# Patient Record
Sex: Female | Born: 1935 | Hispanic: No | Marital: Married | Smoking: Never smoker
Health system: Southern US, Community
[De-identification: ages and names within clinical notes are randomized; demographics above are authoritative.]

## PROBLEM LIST (undated history)

## (undated) DIAGNOSIS — M1990 Unspecified osteoarthritis, unspecified site: Secondary | ICD-10-CM

## (undated) DIAGNOSIS — Z9889 Other specified postprocedural states: Secondary | ICD-10-CM

## (undated) DIAGNOSIS — T4145XA Adverse effect of unspecified anesthetic, initial encounter: Secondary | ICD-10-CM

## (undated) DIAGNOSIS — T8859XA Other complications of anesthesia, initial encounter: Secondary | ICD-10-CM

## (undated) DIAGNOSIS — G919 Hydrocephalus, unspecified: Secondary | ICD-10-CM

## (undated) DIAGNOSIS — R112 Nausea with vomiting, unspecified: Secondary | ICD-10-CM

## (undated) DIAGNOSIS — I10 Essential (primary) hypertension: Secondary | ICD-10-CM

## (undated) DIAGNOSIS — E785 Hyperlipidemia, unspecified: Secondary | ICD-10-CM

## (undated) HISTORY — PX: FRACTURE SURGERY: SHX138

## (undated) HISTORY — PX: CSF SHUNT: SHX92

---

## 1988-10-04 DIAGNOSIS — G919 Hydrocephalus, unspecified: Secondary | ICD-10-CM

## 1988-10-04 HISTORY — DX: Hydrocephalus, unspecified: G91.9

## 2001-03-08 ENCOUNTER — Encounter: Payer: Self-pay | Admitting: Neurosurgery

## 2001-03-08 ENCOUNTER — Encounter: Admission: RE | Admit: 2001-03-08 | Discharge: 2001-03-08 | Payer: Self-pay | Admitting: Neurosurgery

## 2007-03-29 ENCOUNTER — Ambulatory Visit: Payer: Self-pay | Admitting: Vascular Surgery

## 2010-06-18 NOTE — Procedures (Signed)
CAROTID DUPLEX EXAM   INDICATION:  Abdominal __________ .   HISTORY:  Diabetes:  No.  Cardiac:  No.  Hypertension:  Yes.  Smoking:  No.  Previous Surgery:  CV History:  Amaurosis Fugax No, Paresthesias No, Hemiparesis No                                       RIGHT             LEFT  Brachial systolic pressure:         140               140  Brachial Doppler waveforms:         Biphasic          Biphasic  Vertebral direction of flow:        Antegrade         Not well  visualized  DUPLEX VELOCITIES (cm/sec)  CCA peak systolic                   46                67  ECA peak systolic                   86                114  ICA peak systolic                   35                38  ICA end diastolic                   8                 8  PLAQUE MORPHOLOGY:                  Heterogeneous     Heterogeneous  PLAQUE AMOUNT:                      Mild              Mild  PLAQUE LOCATION:                    ICA               ICA   IMPRESSION:  1. 1 to 39% stenosis noted in bilateral ICAs.  2. Antegrade right vertebral arteries.  3. Unable to visualize left vertebral artery.   ___________________________________________  Janetta Hora Fields, MD   MG/MEDQ  D:  03/29/2007  T:  03/30/2007  Job:  [id number]

## 2013-04-14 ENCOUNTER — Emergency Department (HOSPITAL_COMMUNITY)
Admission: EM | Admit: 2013-04-14 | Discharge: 2013-04-14 | Disposition: A | Discharge status: Home / Self Care | Payer: Medicare Other | Attending: Emergency Medicine | Admitting: Emergency Medicine

## 2013-04-14 ENCOUNTER — Encounter (HOSPITAL_COMMUNITY): Payer: Self-pay | Admitting: Emergency Medicine

## 2013-04-14 ENCOUNTER — Emergency Department (HOSPITAL_COMMUNITY): Payer: Medicare Other

## 2013-04-14 DIAGNOSIS — I10 Essential (primary) hypertension: Secondary | ICD-10-CM | POA: Diagnosis not present

## 2013-04-14 DIAGNOSIS — E785 Hyperlipidemia, unspecified: Secondary | ICD-10-CM | POA: Diagnosis not present

## 2013-04-14 DIAGNOSIS — S0990XA Unspecified injury of head, initial encounter: Secondary | ICD-10-CM | POA: Diagnosis present

## 2013-04-14 DIAGNOSIS — S5000XA Contusion of unspecified elbow, initial encounter: Secondary | ICD-10-CM | POA: Insufficient documentation

## 2013-04-14 DIAGNOSIS — Z23 Encounter for immunization: Secondary | ICD-10-CM | POA: Insufficient documentation

## 2013-04-14 DIAGNOSIS — Y9301 Activity, walking, marching and hiking: Secondary | ICD-10-CM | POA: Insufficient documentation

## 2013-04-14 DIAGNOSIS — Y9210 Unspecified residential institution as the place of occurrence of the external cause: Secondary | ICD-10-CM | POA: Diagnosis not present

## 2013-04-14 DIAGNOSIS — W19XXXA Unspecified fall, initial encounter: Secondary | ICD-10-CM | POA: Diagnosis present

## 2013-04-14 DIAGNOSIS — Z79899 Other long term (current) drug therapy: Secondary | ICD-10-CM | POA: Insufficient documentation

## 2013-04-14 DIAGNOSIS — S0083XA Contusion of other part of head, initial encounter: Principal | ICD-10-CM

## 2013-04-14 DIAGNOSIS — T148XXA Other injury of unspecified body region, initial encounter: Secondary | ICD-10-CM | POA: Diagnosis present

## 2013-04-14 DIAGNOSIS — W010XXA Fall on same level from slipping, tripping and stumbling without subsequent striking against object, initial encounter: Secondary | ICD-10-CM | POA: Diagnosis not present

## 2013-04-14 DIAGNOSIS — S0003XA Contusion of scalp, initial encounter: Secondary | ICD-10-CM | POA: Insufficient documentation

## 2013-04-14 DIAGNOSIS — G911 Obstructive hydrocephalus: Secondary | ICD-10-CM | POA: Insufficient documentation

## 2013-04-14 DIAGNOSIS — S1093XA Contusion of unspecified part of neck, initial encounter: Secondary | ICD-10-CM | POA: Diagnosis not present

## 2013-04-14 HISTORY — DX: Essential (primary) hypertension: I10

## 2013-04-14 HISTORY — DX: Hydrocephalus, unspecified: G91.9

## 2013-04-14 HISTORY — DX: Hyperlipidemia, unspecified: E78.5

## 2013-04-14 MED ORDER — TETANUS-DIPHTH-ACELL PERTUSSIS 5-2.5-18.5 LF-MCG/0.5 IM SUSP
0.5000 mL | Freq: Once | INTRAMUSCULAR | Status: AC
  Administered 2013-04-14: 0.5 mL via INTRAMUSCULAR
  Filled 2013-04-14: qty 0.5

## 2013-04-14 MED ORDER — ACETAMINOPHEN 325 MG PO TABS
650.0000 mg | ORAL_TABLET | Freq: Once | ORAL | Status: AC
  Administered 2013-04-14: 650 mg via ORAL
  Filled 2013-04-14: qty 2

## 2013-04-14 NOTE — ED Notes (Signed)
Per pt, was walking down handicap ramp and fell on right side-hematoma to right forehead-no blood thinners-abrasions to right arm

## 2013-04-14 NOTE — Discharge Instructions (Signed)
Abrasion °An abrasion is a cut or scrape of the skin. Abrasions do not extend through all layers of the skin and most heal within 10 days. It is important to care for your abrasion properly to prevent infection. °CAUSES  °Most abrasions are caused by falling on, or gliding across, the ground or other surface. When your skin rubs on something, the outer and inner layer of skin rubs off, causing an abrasion. °DIAGNOSIS  °Your caregiver will be able to diagnose an abrasion during a physical exam.  °TREATMENT  °Your treatment depends on how large and deep the abrasion is. Generally, your abrasion will be cleaned with water and a mild soap to remove any dirt or debris. An antibiotic ointment may be put over the abrasion to prevent an infection. A bandage (dressing) may be wrapped around the abrasion to keep it from getting dirty.  °You may need a tetanus shot if: °· You cannot remember when you had your last tetanus shot. °· You have never had a tetanus shot. °· The injury broke your skin. °If you get a tetanus shot, your arm may swell, get red, and feel warm to the touch. This is common and not a problem. If you need a tetanus shot and you choose not to have one, there is a rare chance of getting tetanus. Sickness from tetanus can be serious.  °HOME CARE INSTRUCTIONS  °· If a dressing was applied, change it at least once a day or as directed by your caregiver. If the bandage sticks, soak it off with warm water.   °· Wash the area with water and a mild soap to remove all the ointment 2 times a day. Rinse off the soap and pat the area dry with a clean towel.   °· Reapply any ointment as directed by your caregiver. This will help prevent infection and keep the bandage from sticking. Use gauze over the wound and under the dressing to help keep the bandage from sticking.   °· Change your dressing right away if it becomes wet or dirty.   °· Only take over-the-counter or prescription medicines for pain, discomfort, or fever as  directed by your caregiver.   °· Follow up with your caregiver within 24 48 hours for a wound check, or as directed. If you were not given a wound-check appointment, look closely at your abrasion for redness, swelling, or pus. These are signs of infection. °SEEK IMMEDIATE MEDICAL CARE IF:  °· You have increasing pain in the wound.   °· You have redness, swelling, or tenderness around the wound.   °· You have pus coming from the wound.   °· You have a fever or persistent symptoms for more than 2 3 days. °· You have a fever and your symptoms suddenly get worse. °· You have a bad smell coming from the wound or dressing.   °MAKE SURE YOU:  °· Understand these instructions. °· Will watch your condition. °· Will get help right away if you are not doing well or get worse. °Document Released: 10/30/2004 Document Revised: 01/07/2012 Document Reviewed: 12/24/2010 °ExitCare® Patient Information ©2014 [employer]. ° °

## 2013-04-14 NOTE — ED Provider Notes (Signed)
CSN: [id number]     Arrival date & time 04/14/13  1039 History   First MD Initiated Contact with Patient 04/14/13 1102     Chief Complaint  Patient presents with  . Fall     (Consider location/radiation/quality/duration/timing/severity/associated sxs/prior Treatment) Patient is a 80 y.o. female presenting with fall. The history is provided by the patient.  Fall This is a new problem. The current episode started 1 to 2 hours ago. Episode frequency: once. The problem has been resolved. Associated symptoms include headaches (mild). Pertinent negatives include no chest pain, no abdominal pain and no shortness of breath. Nothing aggravates the symptoms. Nothing relieves the symptoms. She has tried nothing for the symptoms. The treatment provided no relief.    Past Medical History  Diagnosis Date  . Hypertension   . Hyperlipidemia   . Hydrocephalus 1990's   History reviewed. No pertinent past surgical history. No family history on file. History  Substance Use Topics  . Smoking status: Never Smoker   . Smokeless tobacco: Not on file  . Alcohol Use: No   OB History   Grav Para Term Preterm Abortions TAB SAB Ect Mult Living                 Review of Systems  Constitutional: Negative for fever and fatigue.  HENT: Negative for congestion and drooling.   Eyes: Negative for pain.  Respiratory: Negative for cough and shortness of breath.   Cardiovascular: Negative for chest pain.  Gastrointestinal: Negative for nausea, vomiting, abdominal pain and diarrhea.  Genitourinary: Negative for dysuria and hematuria.  Musculoskeletal: Negative for back pain, gait problem and neck pain.  Skin: Negative for color change.  Neurological: Positive for headaches (mild). Negative for dizziness.  Hematological: Negative for adenopathy.  Psychiatric/Behavioral: Negative for behavioral problems.  All other systems reviewed and are negative.      Allergies  Review of patient's allergies indicates  no known allergies.  Home Medications   Current Outpatient Rx  Name  Route  Sig  Dispense  Refill  . amLODipine (NORVASC) 2.5 MG tablet   Oral   Take 2.5 mg by mouth daily.          Marland Kitchen atorvastatin (LIPITOR) 10 MG tablet   Oral   Take 10 mg by mouth daily.          . beta carotene w/minerals (OCUVITE) tablet   Oral   Take 1 tablet by mouth daily.         . Fish Oil OIL   Oral   Take 1 capsule by mouth daily.         Marland Kitchen losartan (COZAAR) 100 MG tablet   Oral   Take 100 mg by mouth daily.          . Multiple Vitamin (MULTIVITAMIN WITH MINERALS) TABS tablet   Oral   Take 1 tablet by mouth daily.         . Multiple Vitamins-Minerals (ICAPS MV PO)   Oral   Take 1 tablet by mouth daily.         Marland Kitchen zolpidem (AMBIEN) 5 MG tablet   Oral   Take 5 mg by mouth at bedtime.           BP 148/84  Pulse 98  Temp(Src) 98.1 F (36.7 C) (Oral)  Resp 18  SpO2 96% Physical Exam  Nursing note and vitals reviewed. Constitutional: She is oriented to person, place, and time. She appears well-developed and well-nourished.  HENT:  Head: Normocephalic.  Mouth/Throat: Oropharynx is clear and moist. No oropharyngeal exudate.  Mild hematoma to right lateral for head.  Eyes: Conjunctivae and EOM are normal. Pupils are equal, round, and reactive to light.  Neck: Normal range of motion. Neck supple.  No vertebral tenderness noted.  Cardiovascular: Normal rate, regular rhythm, normal heart sounds and intact distal pulses.  Exam reveals no gallop and no friction rub.   No murmur heard. Pulmonary/Chest: Effort normal and breath sounds normal. No respiratory distress. She has no wheezes.  Abdominal: Soft. Bowel sounds are normal. There is no tenderness. There is no rebound and no guarding.  Musculoskeletal: Normal range of motion. She exhibits no edema.  Mild bruising to the right medial elbow. No focal ttp of right elbow.   Several mild abrasions to the right forearm.  No focal  tenderness to palpation of the hips bilaterally. The patient is able to stand and bare weight without any pain.  Neurological: She is alert and oriented to person, place, and time.  Skin: Skin is warm and dry.  Psychiatric: She has a normal mood and affect. Her behavior is normal.    ED Course  Procedures (including critical care time) Labs Review Labs Reviewed - No data to display Imaging Review Dg Elbow Complete Right  04/14/2013   CLINICAL DATA:  Fall was pancreatitis  EXAM: RIGHT ELBOW - COMPLETE 3+ VIEW  COMPARISON:  None.  FINDINGS: There is a small joint effusion, with visible posterior fat pad. Distorted appearance of the capitellum and radial head is likely from remote fracture. No acute fracture identified. No malalignment.  IMPRESSION: 1. No acute fracture or dislocation identified. 2. Small elbow joint effusion. Cannot exclude an occult fracture, especially of the radial head. 3. Remote injury with posttraumatic deformity of the lateral elbow.   Electronically Signed   By: Tiburcio Pea M.D.   On: 04/14/2013 11:58   Ct Head Wo Contrast  04/14/2013   CLINICAL DATA:  History of shunts, fell today hit right temporal area, headache  EXAM: CT HEAD WITHOUT CONTRAST  TECHNIQUE: Contiguous axial images were obtained from the base of the skull through the vertex without intravenous contrast.  COMPARISON:  None.  FINDINGS: No skull fracture. No evidence of hemorrhage or extra-axial fluid. No vascular territory infarct or mass. There is severe diffuse atrophy involving both the supratentorial and infratentorial compartments. There is no hydrocephalus. There is low attenuation in the deep periventricular white matter consistent with small vessel chronic ischemic change.  There is a small scalp hematoma over the right temporal parietal region. This occurs just inferior to where a right VP shunt tube crosses the calvarium. This shunt tube extends into the frontal horn of the right ventricle. There is  a second shunt tube. This is seen extending with its tip into the body of the left ventricle, reaching that location by entering the cranium in the left occipital region. There is evidence of left occipital craniotomy overlying the left cerebellar hemisphere. Shunt tube extends through this area. Posterior and left lateral to the upper cervical spine there is a circumscribed fluid collection in the deep soft tissues of the posterior neck, measuring 29 x 54 mm and showing cerebral spinal fluid attenuation.  IMPRESSION: 1. No acute traumatic injury 2. 2 ventricular shunts noted. On the left side, the shunt is seen inferiorly in the occipital region and appears to be associated with postoperative change including a 5 cm thin walled fluid collection which appears of CSF density.  Electronically Signed   By: Esperanza Heiraymond  Rubner M.D.   On: 04/14/2013 11:44     EKG Interpretation None      MDM   Final diagnoses:  Hematoma  Contusion of elbow  Fall    11:20 AM 80 y.o. female who presents with a mechanical fall which occurred prior to arrival. The patient lost her balance while walking across a handicapped ramp. She fell landing on her right side hitting her head. She denies loss of consciousness. She currently has minimal if any pain. She has evidence of a hematoma on her right for evidence of bruising of her right elbow. She is able to stand on exam and denies any hip pain. Will get screening imaging. The patient's son is here with her and was with her when she fell.  12:06 PM: Imaging non-contrib. Will update tdap. Pt has minimal pain in right elbow and does not want sling. I notified pt and son of elbow effusion and possibility of occult fx.  I have discussed the diagnosis/risks/treatment options with the patient and family and believe the pt to be eligible for discharge home to follow-up with pcp as needed. We also discussed returning to the ED immediately if new or worsening sx occur. We discussed the sx  which are most concerning (e.g., worsening pain, AMS, HA) that necessitate immediate return. Medications administered to the patient during their visit and any new prescriptions provided to the patient are listed below.  Medications given during this visit Medications  acetaminophen (TYLENOL) tablet 650 mg (650 mg Oral Given 04/14/13 1150)    New Prescriptions   No medications on file     Junius ArgyleForrest S Laiya Wisby, MD 04/14/13 1209

## 2013-11-14 ENCOUNTER — Ambulatory Visit (HOSPITAL_COMMUNITY)
Admission: EM | Admit: 2013-11-14 | Discharge: 2013-11-15 | Disposition: A | Discharge status: Home / Self Care | Payer: Medicare Other | Attending: Gastroenterology | Admitting: Gastroenterology

## 2013-11-14 ENCOUNTER — Emergency Department (HOSPITAL_COMMUNITY): Payer: Medicare Other

## 2013-11-14 ENCOUNTER — Encounter (HOSPITAL_COMMUNITY): Payer: Self-pay | Admitting: Emergency Medicine

## 2013-11-14 DIAGNOSIS — M19049 Primary osteoarthritis, unspecified hand: Secondary | ICD-10-CM | POA: Insufficient documentation

## 2013-11-14 DIAGNOSIS — E785 Hyperlipidemia, unspecified: Secondary | ICD-10-CM | POA: Insufficient documentation

## 2013-11-14 DIAGNOSIS — K209 Esophagitis, unspecified without bleeding: Secondary | ICD-10-CM

## 2013-11-14 DIAGNOSIS — X58XXXA Exposure to other specified factors, initial encounter: Secondary | ICD-10-CM | POA: Diagnosis not present

## 2013-11-14 DIAGNOSIS — I10 Essential (primary) hypertension: Secondary | ICD-10-CM | POA: Insufficient documentation

## 2013-11-14 DIAGNOSIS — Y929 Unspecified place or not applicable: Secondary | ICD-10-CM | POA: Insufficient documentation

## 2013-11-14 DIAGNOSIS — R1314 Dysphagia, pharyngoesophageal phase: Secondary | ICD-10-CM

## 2013-11-14 DIAGNOSIS — T18128A Food in esophagus causing other injury, initial encounter: Secondary | ICD-10-CM | POA: Diagnosis not present

## 2013-11-14 DIAGNOSIS — K222 Esophageal obstruction: Secondary | ICD-10-CM | POA: Diagnosis not present

## 2013-11-14 DIAGNOSIS — R05 Cough: Secondary | ICD-10-CM

## 2013-11-14 DIAGNOSIS — R059 Cough, unspecified: Secondary | ICD-10-CM

## 2013-11-14 HISTORY — DX: Nausea with vomiting, unspecified: R11.2

## 2013-11-14 HISTORY — DX: Adverse effect of unspecified anesthetic, initial encounter: T41.45XA

## 2013-11-14 HISTORY — DX: Nausea with vomiting, unspecified: Z98.890

## 2013-11-14 HISTORY — DX: Unspecified osteoarthritis, unspecified site: M19.90

## 2013-11-14 HISTORY — DX: Other complications of anesthesia, initial encounter: T88.59XA

## 2013-11-14 LAB — I-STAT CHEM 8, ED
BUN: 28 mg/dL — ABNORMAL HIGH (ref 6–23)
CALCIUM ION: 1.15 mmol/L (ref 1.13–1.30)
Chloride: 103 mEq/L (ref 96–112)
Creatinine, Ser: 1.2 mg/dL — ABNORMAL HIGH (ref 0.50–1.10)
GLUCOSE: 136 mg/dL — AB (ref 70–99)
HEMATOCRIT: 47 % — AB (ref 36.0–46.0)
HEMOGLOBIN: 16 g/dL — AB (ref 12.0–15.0)
Potassium: 3.8 mEq/L (ref 3.7–5.3)
Sodium: 141 mEq/L (ref 137–147)
TCO2: 29 mmol/L (ref 0–100)

## 2013-11-14 MED ORDER — GLUCAGON HCL RDNA (DIAGNOSTIC) 1 MG IJ SOLR
1.0000 mg | Freq: Once | INTRAMUSCULAR | Status: AC
  Administered 2013-11-14: 1 mg via INTRAVENOUS
  Filled 2013-11-14: qty 1

## 2013-11-14 NOTE — ED Notes (Signed)
Pt was eating pork chop and feels like something is stuck in her throat; pt unable to drink water, pt coughing and spitting; pt has a difficult time speaking

## 2013-11-14 NOTE — ED Provider Notes (Signed)
CSN: [id number]     Arrival date & time 11/14/13  2059 History   First MD Initiated Contact with Patient 11/14/13 2151     Chief Complaint  Patient presents with  . Swallowed Foreign Body     (Consider location/radiation/quality/duration/timing/severity/associated sxs/prior Treatment) HPI [age] year old female about 90 minutes prior to arrival and she was eating dinner and felt pork chop get stuck in her throat with a persistent foreign body sensation in her esophagus somewhere between her throat and her stomach with patient unable to hold down saliva unable to hold down water with frequent regurgitation of saliva with coughing and choking at the time of the episode the patient speaking normally now with transient shortness of breath resolved after choking episode with no chest pain no abdominal pain just persistent foreign body sensation in her esophagus when she tries to swallow. Past Medical History  Diagnosis Date  . Hypertension   . Hyperlipidemia   . Hydrocephalus 1990's  . Complication of anesthesia   . PONV (postoperative nausea and vomiting)   . Arthritis     hands   Past Surgical History  Procedure Laterality Date  . Csf shunt    . Fracture surgery      lt knee, rt elbow  . Esophagogastroduodenoscopy N/A 11/15/2013    Procedure: ESOPHAGOGASTRODUODENOSCOPY (EGD);  Surgeon: Louis Meckelobert D Kaplan, MD;  Location: [facility];  Service: Endoscopy;  Laterality: N/A;   History reviewed. No pertinent family history. History  Substance Use Topics  . Smoking status: Never Smoker   . Smokeless tobacco: Not on file  . Alcohol Use: No   OB History    No data available     Review of Systems 10 Systems reviewed and are negative for acute change except as noted in the HPI.   Allergies  Review of patient's allergies indicates no known allergies.  Home Medications   Prior to Admission medications   Medication Sig Start Date End Date Taking? Authorizing Provider  ALPRAZolam  Prudy Feeler(XANAX) 0.5 MG tablet Take 0.5 mg by mouth at bedtime as needed for anxiety.   Yes Historical Provider, MD  amLODipine (NORVASC) 2.5 MG tablet Take 2.5 mg by mouth every morning.  04/13/13  Yes Historical Provider, MD  atorvastatin (LIPITOR) 10 MG tablet Take 10 mg by mouth every morning.  04/13/13  Yes Historical Provider, MD  Fish Oil OIL Take 1 capsule by mouth every morning.    Yes Historical Provider, MD  losartan (COZAAR) 100 MG tablet Take 100 mg by mouth every morning.  04/13/13  Yes Historical Provider, MD  Multiple Vitamin (MULTIVITAMIN WITH MINERALS) TABS tablet Take 1 tablet by mouth every morning.    Yes Historical Provider, MD  Multiple Vitamins-Minerals (ICAPS MV PO) Take 1 tablet by mouth every morning.    Yes Historical Provider, MD  CALCIUM PO Take 1 tablet by mouth 2 (two) times daily.    Historical Provider, MD  pantoprazole (PROTONIX) 40 MG tablet Take 40 mg by mouth 2 (two) times daily.    Historical Provider, MD   BP 105/35 mmHg  Pulse 102  Temp(Src) 99.2 F (37.3 C) (Oral)  Resp 23  Ht 5\' 6"  (1.676 m)  Wt 140 lb (63.504 kg)  BMI 22.61 kg/m2  SpO2 90% Physical Exam  Nursing note and vitals reviewed. Constitutional:  Awake, alert, nontoxic appearance.  HENT:  Head: Atraumatic.  Eyes: Right eye exhibits no discharge. Left eye exhibits no discharge.  Neck: Neck supple.  Cardiovascular: Normal rate and regular rhythm.  No murmur heard. Pulmonary/Chest: Effort normal and breath sounds normal. No respiratory distress. She has no wheezes. She has no rales. She exhibits no tenderness.  Speech clear no stridor pulse oximetry normal 97% room air  Abdominal: Soft. Bowel sounds are normal. She exhibits no distension. There is no tenderness. There is no rebound and no guarding.  Musculoskeletal: She exhibits no tenderness.  Baseline ROM, no obvious new focal weakness.  Neurological: She is alert.  Mental status and motor strength appears baseline for patient and situation.   Skin: No rash noted.  Psychiatric: She has a normal mood and affect.    ED Course  Procedures (including critical care time) 2350 d/w GI who requests keep Pt NPO in ED for scope in AM. 0015 Pt did regurgitate out ~2x3cm piece of pork and states swallowing easier but still unable to swallow water so will remain in ED; no dyspnea and phonation normal with normal pulse ox on RA. Hand-off performed.  Labs Review Labs Reviewed  I-STAT CHEM 8, ED - Abnormal; Notable for the following:    BUN 28 (*)    Creatinine, Ser 1.20 (*)    Glucose, Bld 136 (*)    Hemoglobin 16.0 (*)    HCT 47.0 (*)    All other components within normal limits    Imaging Review No results found.   EKG Interpretation None      MDM   Final diagnoses:  Cough    Patient / Family / Caregiver understand and agree with initial ED impression and plan with expectations set for ED visit. Dispo pending.    Hurman HornJohn M Azya Barbero, MD 12/04/13 2106

## 2013-11-15 ENCOUNTER — Encounter (HOSPITAL_COMMUNITY): Payer: Self-pay | Admitting: *Deleted

## 2013-11-15 ENCOUNTER — Encounter (HOSPITAL_COMMUNITY)
Admission: EM | Disposition: A | Discharge status: Home / Self Care | Payer: Self-pay | Source: Home / Self Care | Attending: Emergency Medicine

## 2013-11-15 DIAGNOSIS — K222 Esophageal obstruction: Secondary | ICD-10-CM

## 2013-11-15 DIAGNOSIS — R05 Cough: Secondary | ICD-10-CM

## 2013-11-15 DIAGNOSIS — K209 Esophagitis, unspecified without bleeding: Secondary | ICD-10-CM

## 2013-11-15 DIAGNOSIS — R1314 Dysphagia, pharyngoesophageal phase: Secondary | ICD-10-CM

## 2013-11-15 HISTORY — PX: ESOPHAGOGASTRODUODENOSCOPY: SHX5428

## 2013-11-15 SURGERY — EGD (ESOPHAGOGASTRODUODENOSCOPY)
Anesthesia: Moderate Sedation

## 2013-11-15 MED ORDER — PANTOPRAZOLE SODIUM 40 MG PO TBEC: 40.0000 mg | DELAYED_RELEASE_TABLET | Freq: Two times a day (BID) | ORAL | Status: DC

## 2013-11-15 MED ORDER — SODIUM CHLORIDE 0.9 % IV SOLN
INTRAVENOUS | Status: DC
  Administered 2013-11-15: 500 mL via INTRAVENOUS

## 2013-11-15 MED ORDER — BUTAMBEN-TETRACAINE-BENZOCAINE 2-2-14 % EX AERO
INHALATION_SPRAY | CUTANEOUS | Status: DC | PRN
  Administered 2013-11-15: 2 via TOPICAL

## 2013-11-15 MED ORDER — MIDAZOLAM HCL 10 MG/2ML IJ SOLN
INTRAMUSCULAR | Status: DC | PRN
  Administered 2013-11-15 (×2): 1 mg via INTRAVENOUS

## 2013-11-15 MED ORDER — MIDAZOLAM HCL 10 MG/2ML IJ SOLN
INTRAMUSCULAR | Status: AC
  Filled 2013-11-15: qty 2

## 2013-11-15 MED ORDER — FENTANYL CITRATE 0.05 MG/ML IJ SOLN
INTRAMUSCULAR | Status: DC | PRN
  Administered 2013-11-15: 25 ug via INTRAVENOUS
  Administered 2013-11-15: 12.5 ug via INTRAVENOUS

## 2013-11-15 MED ORDER — FENTANYL CITRATE 0.05 MG/ML IJ SOLN
INTRAMUSCULAR | Status: AC
  Filled 2013-11-15: qty 2

## 2013-11-15 NOTE — ED Notes (Signed)
Endo came to get patient

## 2013-11-15 NOTE — H&P (Signed)
               _                                                                                                                History of Present Illness:  Ms. Katrina Flynn is an 80 year old white female with history of hypertension presented to the ED for inability to swallow.  While eating dinner for her chest and she has been unable to swallow liquids or solids.  Glucagon was given unsuccessfully.  She denies odynophagia or history of dysphagia.  There is no history of pyrosis.   Past Medical History  Diagnosis Date  . Hypertension   . Hyperlipidemia   . Hydrocephalus 1990's  . Complication of anesthesia   . PONV (postoperative nausea and vomiting)   . Arthritis     hands   Past Surgical History  Procedure Laterality Date  . Csf shunt    . Fracture surgery      lt knee, rt elbow   family history is not on file. Current Facility-Administered Medications  Medication Dose Route Frequency Provider Last Rate Last Dose  . 0.9 %  sodium chloride infusion   Intravenous Continuous Louis Meckelobert D Kaplan, MD 10 mL/hr at 11/15/13 0716 500 mL at 11/15/13 0716   Allergies as of 11/14/2013  . (No Known Allergies)    reports that she has never smoked. She does not have any smokeless tobacco history on file. She reports that she does not drink alcohol. Her drug history is not on file.   Review of Systems: Pertinent positive and negative review of systems were noted in the above HPI section. All other review of systems were otherwise negative.  Vital signs were reviewed in today's medical record Physical Exam: General: Well developed , well nourished, no acute distress Skin: anicteric Head: Normocephalic and atraumatic Eyes:  sclerae anicteric, EOMI Ears: Normal auditory acuity Mouth: No deformity or lesions Neck: Supple, no masses or thyromegaly Lungs: Clear throughout to auscultation Heart: Regular rate and rhythm; no murmurs, rubs or bruits Abdomen: Soft, non tender and non distended. No  masses, hepatosplenomegaly or hernias noted. Normal Bowel sounds Rectal:deferred Musculoskeletal: Symmetrical with no gross deformities  Skin: No lesions on visible extremities Pulses:  Normal pulses noted Extremities: No clubbing, cyanosis, edema or deformities noted Neurological: Alert oriented x 4, grossly nonfocal Cervical Nodes:  No significant cervical adenopathy Inguinal Nodes: No significant inguinal adenopathy Psychological:  Alert and cooperative. Normal mood and affect  Impression - food impaction of the esophagus.  Suspect fixed esophageal stricture although severe dysmotility could be operative.  Plan-upper endoscopy

## 2013-11-15 NOTE — Discharge Instructions (Signed)
Full liquids then advance to soft dietGastrointestinal Endoscopy, Care After Refer to this sheet in the next few weeks. These instructions provide you with information on caring for yourself after your procedure. Your caregiver may also give you more specific instructions. Your treatment has been planned according to current medical practices, but problems sometimes occur. Call your caregiver if you have any problems or questions after your procedure. HOME CARE INSTRUCTIONS  If you were given medicine to help you relax (sedative), do not drive, operate machinery, or sign important documents for 24 hours.  Avoid alcohol and hot or warm beverages for the first 24 hours after the procedure.  Only take over-the-counter or prescription medicines for pain, discomfort, or fever as directed by your caregiver. You may resume taking your normal medicines unless your caregiver tells you otherwise. Ask your caregiver when you may resume taking medicines that may cause bleeding, such as aspirin, clopidogrel, or warfarin.  You may return to your normal diet and activities on the day after your procedure, or as directed by your caregiver. Walking may help to reduce any bloated feeling in your abdomen.  Drink enough fluids to keep your urine clear or pale yellow.  You may gargle with salt water if you have a sore throat. SEEK IMMEDIATE MEDICAL CARE IF:  You have severe nausea or vomiting.  You have severe abdominal pain, abdominal cramps that last longer than 6 hours, or abdominal swelling (distention).  You have severe shoulder or back pain.  You have trouble swallowing.  You have shortness of breath, your breathing is shallow, or you are breathing faster than normal.  You have a fever or a rapid heartbeat.  You vomit blood or material that looks like coffee grounds.  You have bloody, black, or tarry stools. MAKE SURE YOU:  Understand these instructions.  Will watch your condition.  Will get  help right away if you are not doing well or get worse. Document Released: 09/04/2003 Document Revised: 06/06/2013 Document Reviewed: 04/22/2011 [facility] Patient Information 2015 [employer] [place], [place]. This information is not intended to replace advice given to you by your health care provider. Make sure you discuss any questions you have with your health care provider.

## 2013-11-15 NOTE — Op Note (Signed)
[facility] [address] [place], [id number]   ENDOSCOPY PROCEDURE REPORT  PATIENT: Flynn, Katrina  MR#: [id number] BIRTHDATE: October 05, 1922 , 89  yrs. old GENDER: female ENDOSCOPIST: Louis Meckelobert D Kaplan, MD REFERRED BY: PROCEDURE DATE:  11/15/2013 PROCEDURE:  EGD w/ fb removal ASA CLASS:     Class III INDICATIONS:  foreign body removal from esophagus. MEDICATIONS: Versed 3 mg IV  , fentanyl 37.5 mg IV TOPICAL ANESTHETIC: Cetacaine Spray  DESCRIPTION OF PROCEDURE: After the risks benefits and alternatives of the procedure were thoroughly explained, informed consent was obtained.  The Pentax Gastroscope [id number] endoscope was introduced through the mouth and advanced to the second portion of the duodenum , Without limitations.  The instrument was slowly withdrawn as the mucosa was fully examined.    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  The stricture was traversable.   There was a moderate amount of residual food seen in the entire esophagus.   There was LA Class D esophagitis (Mucosal breaks involving more than 75% of esophageal circumference) noted. Except for the findings listed the EGD was otherwise normal. Retroflexed views revealed no abnormalities.     The scope was then withdrawn from the patient and the procedure completed.  COMPLICATIONS: There were no immediate complications.  ENDOSCOPIC IMPRESSION: 1.   There was a short stricture at the gastroesophageal junction 2.   There was a moderate amount of residual food seen in the entire esophagus 3.   There was LA Class D esophagitis noted 4.   EGD was otherwise normal  RECOMMENDATIONS: 1.  begin Protonix 40 mg twice a day 2.  soft diet 3.  endoscopy with balloon dilation 2-3 weeks  REPEAT EXAM:  eSigned:  Louis Meckelobert D Kaplan, MD 11/15/2013 8:16 AM    CC:  PATIENT NAME:  Flynn, Katrina MR#: [id number]

## 2013-11-16 ENCOUNTER — Encounter (HOSPITAL_COMMUNITY): Payer: Self-pay | Admitting: Gastroenterology

## 2013-11-17 ENCOUNTER — Telehealth: Payer: Self-pay

## 2013-11-17 NOTE — Telephone Encounter (Signed)
Spoke with the patient about scheduling her EGD with dilation the first week of November. She took the phone number and name, but cannot make the appointment until she speaks with her son. Her son is out of town right now, but she will tell him and have him call.

## 2013-11-21 ENCOUNTER — Other Ambulatory Visit: Payer: Self-pay

## 2013-11-21 DIAGNOSIS — K222 Esophageal obstruction: Secondary | ICD-10-CM

## 2013-11-21 NOTE — Telephone Encounter (Signed)
Spoke with the son. Patient sheduled for 12/19/13 EGD with dilation at 9:30

## 2013-11-28 ENCOUNTER — Encounter (HOSPITAL_COMMUNITY): Payer: Self-pay | Admitting: Pharmacy Technician

## 2013-12-06 ENCOUNTER — Emergency Department (HOSPITAL_COMMUNITY): Payer: Medicare Other

## 2013-12-06 ENCOUNTER — Encounter (HOSPITAL_COMMUNITY): Payer: Self-pay | Admitting: *Deleted

## 2013-12-06 ENCOUNTER — Other Ambulatory Visit: Payer: Self-pay

## 2013-12-06 ENCOUNTER — Inpatient Hospital Stay (HOSPITAL_COMMUNITY)
Admission: EM | Admit: 2013-12-06 | Discharge: 2013-12-14 | DRG: 177 | Disposition: A | Discharge status: Skilled Nursing Facility | Payer: Medicare Other | Attending: Internal Medicine | Admitting: Internal Medicine

## 2013-12-06 DIAGNOSIS — E43 Unspecified severe protein-calorie malnutrition: Secondary | ICD-10-CM | POA: Diagnosis present

## 2013-12-06 DIAGNOSIS — I272 Other secondary pulmonary hypertension: Secondary | ICD-10-CM | POA: Diagnosis present

## 2013-12-06 DIAGNOSIS — J9601 Acute respiratory failure with hypoxia: Secondary | ICD-10-CM | POA: Diagnosis present

## 2013-12-06 DIAGNOSIS — R531 Weakness: Secondary | ICD-10-CM

## 2013-12-06 DIAGNOSIS — Z8673 Personal history of transient ischemic attack (TIA), and cerebral infarction without residual deficits: Secondary | ICD-10-CM

## 2013-12-06 DIAGNOSIS — J811 Chronic pulmonary edema: Secondary | ICD-10-CM | POA: Diagnosis present

## 2013-12-06 DIAGNOSIS — I4891 Unspecified atrial fibrillation: Secondary | ICD-10-CM | POA: Diagnosis not present

## 2013-12-06 DIAGNOSIS — K210 Gastro-esophageal reflux disease with esophagitis: Secondary | ICD-10-CM | POA: Diagnosis present

## 2013-12-06 DIAGNOSIS — Z800 Family history of malignant neoplasm of digestive organs: Secondary | ICD-10-CM

## 2013-12-06 DIAGNOSIS — K449 Diaphragmatic hernia without obstruction or gangrene: Secondary | ICD-10-CM | POA: Diagnosis present

## 2013-12-06 DIAGNOSIS — J690 Pneumonitis due to inhalation of food and vomit: Principal | ICD-10-CM

## 2013-12-06 DIAGNOSIS — Z289 Immunization not carried out for unspecified reason: Secondary | ICD-10-CM

## 2013-12-06 DIAGNOSIS — I129 Hypertensive chronic kidney disease with stage 1 through stage 4 chronic kidney disease, or unspecified chronic kidney disease: Secondary | ICD-10-CM | POA: Diagnosis present

## 2013-12-06 DIAGNOSIS — I350 Nonrheumatic aortic (valve) stenosis: Secondary | ICD-10-CM | POA: Diagnosis present

## 2013-12-06 DIAGNOSIS — E46 Unspecified protein-calorie malnutrition: Secondary | ICD-10-CM | POA: Diagnosis present

## 2013-12-06 DIAGNOSIS — N3281 Overactive bladder: Secondary | ICD-10-CM | POA: Diagnosis present

## 2013-12-06 DIAGNOSIS — W19XXXA Unspecified fall, initial encounter: Secondary | ICD-10-CM | POA: Diagnosis present

## 2013-12-06 DIAGNOSIS — E871 Hypo-osmolality and hyponatremia: Secondary | ICD-10-CM | POA: Diagnosis present

## 2013-12-06 DIAGNOSIS — R918 Other nonspecific abnormal finding of lung field: Secondary | ICD-10-CM

## 2013-12-06 DIAGNOSIS — M160 Bilateral primary osteoarthritis of hip: Secondary | ICD-10-CM | POA: Diagnosis present

## 2013-12-06 DIAGNOSIS — Z982 Presence of cerebrospinal fluid drainage device: Secondary | ICD-10-CM

## 2013-12-06 DIAGNOSIS — I5030 Unspecified diastolic (congestive) heart failure: Secondary | ICD-10-CM | POA: Diagnosis present

## 2013-12-06 DIAGNOSIS — R05 Cough: Secondary | ICD-10-CM

## 2013-12-06 DIAGNOSIS — E785 Hyperlipidemia, unspecified: Secondary | ICD-10-CM | POA: Diagnosis present

## 2013-12-06 DIAGNOSIS — K222 Esophageal obstruction: Secondary | ICD-10-CM

## 2013-12-06 DIAGNOSIS — R627 Adult failure to thrive: Secondary | ICD-10-CM | POA: Diagnosis present

## 2013-12-06 DIAGNOSIS — R4182 Altered mental status, unspecified: Secondary | ICD-10-CM

## 2013-12-06 DIAGNOSIS — I10 Essential (primary) hypertension: Secondary | ICD-10-CM | POA: Diagnosis present

## 2013-12-06 DIAGNOSIS — E860 Dehydration: Secondary | ICD-10-CM | POA: Diagnosis present

## 2013-12-06 DIAGNOSIS — M810 Age-related osteoporosis without current pathological fracture: Secondary | ICD-10-CM | POA: Diagnosis present

## 2013-12-06 DIAGNOSIS — R059 Cough, unspecified: Secondary | ICD-10-CM

## 2013-12-06 DIAGNOSIS — R296 Repeated falls: Secondary | ICD-10-CM | POA: Diagnosis present

## 2013-12-06 DIAGNOSIS — Z79899 Other long term (current) drug therapy: Secondary | ICD-10-CM | POA: Diagnosis not present

## 2013-12-06 DIAGNOSIS — J189 Pneumonia, unspecified organism: Secondary | ICD-10-CM | POA: Diagnosis present

## 2013-12-06 DIAGNOSIS — R1313 Dysphagia, pharyngeal phase: Secondary | ICD-10-CM | POA: Diagnosis present

## 2013-12-06 DIAGNOSIS — N189 Chronic kidney disease, unspecified: Secondary | ICD-10-CM | POA: Diagnosis present

## 2013-12-06 DIAGNOSIS — R069 Unspecified abnormalities of breathing: Secondary | ICD-10-CM

## 2013-12-06 DIAGNOSIS — N179 Acute kidney failure, unspecified: Secondary | ICD-10-CM | POA: Diagnosis present

## 2013-12-06 DIAGNOSIS — I959 Hypotension, unspecified: Secondary | ICD-10-CM | POA: Diagnosis present

## 2013-12-06 DIAGNOSIS — K209 Esophagitis, unspecified without bleeding: Secondary | ICD-10-CM

## 2013-12-06 DIAGNOSIS — I480 Paroxysmal atrial fibrillation: Secondary | ICD-10-CM | POA: Diagnosis present

## 2013-12-06 LAB — BASIC METABOLIC PANEL
Anion gap: 15 (ref 5–15)
BUN: 26 mg/dL — AB (ref 6–23)
CO2: 24 mEq/L (ref 19–32)
Calcium: 9.3 mg/dL (ref 8.4–10.5)
Chloride: 97 mEq/L (ref 96–112)
Creatinine, Ser: 1.53 mg/dL — ABNORMAL HIGH (ref 0.50–1.10)
GFR calc Af Amer: 34 mL/min — ABNORMAL LOW (ref >90)
GFR, EST NON AFRICAN AMERICAN: 29 mL/min — AB (ref >90)
Glucose, Bld: 198 mg/dL — ABNORMAL HIGH (ref 70–99)
Potassium: 4.6 mEq/L (ref 3.7–5.3)
SODIUM: 136 meq/L — AB (ref 137–147)

## 2013-12-06 LAB — CBC WITH DIFFERENTIAL/PLATELET
Basophils Absolute: 0 10*3/uL (ref 0.0–0.1)
Basophils Relative: 0 % (ref 0–1)
EOS ABS: 0 10*3/uL (ref 0.0–0.7)
Eosinophils Relative: 0 % (ref 0–5)
HCT: 33.5 % — ABNORMAL LOW (ref 36.0–46.0)
Hemoglobin: 11.4 g/dL — ABNORMAL LOW (ref 12.0–15.0)
LYMPHS PCT: 4 % — AB (ref 12–46)
Lymphs Abs: 0.8 10*3/uL (ref 0.7–4.0)
MCH: 31.5 pg (ref 26.0–34.0)
MCHC: 34 g/dL (ref 30.0–36.0)
MCV: 92.5 fL (ref 78.0–100.0)
Monocytes Absolute: 2 10*3/uL — ABNORMAL HIGH (ref 0.1–1.0)
Monocytes Relative: 10 % (ref 3–12)
Neutro Abs: 17 10*3/uL — ABNORMAL HIGH (ref 1.7–7.7)
Neutrophils Relative %: 86 % — ABNORMAL HIGH (ref 43–77)
Platelets: 310 10*3/uL (ref 150–400)
RBC: 3.62 MIL/uL — ABNORMAL LOW (ref 3.87–5.11)
RDW: 12.4 % (ref 11.5–15.5)
WBC: 19.8 10*3/uL — AB (ref 4.0–10.5)

## 2013-12-06 LAB — URINALYSIS, ROUTINE W REFLEX MICROSCOPIC
Bilirubin Urine: NEGATIVE
Glucose, UA: NEGATIVE mg/dL
HGB URINE DIPSTICK: NEGATIVE
Ketones, ur: NEGATIVE mg/dL
NITRITE: NEGATIVE
PROTEIN: 100 mg/dL — AB
Specific Gravity, Urine: 1.022 (ref 1.005–1.030)
UROBILINOGEN UA: 1 mg/dL (ref 0.0–1.0)
pH: 5.5 (ref 5.0–8.0)

## 2013-12-06 LAB — MRSA PCR SCREENING: MRSA by PCR: NEGATIVE

## 2013-12-06 LAB — URINE MICROSCOPIC-ADD ON

## 2013-12-06 LAB — POC OCCULT BLOOD, ED: FECAL OCCULT BLD: NEGATIVE

## 2013-12-06 LAB — I-STAT CG4 LACTIC ACID, ED: Lactic Acid, Venous: 1.24 mmol/L (ref 0.5–2.2)

## 2013-12-06 MED ORDER — ALPRAZOLAM 0.5 MG PO TABS: 0.5000 mg | ORAL_TABLET | Freq: Every evening | ORAL | Status: DC | PRN

## 2013-12-06 MED ORDER — GUAIFENESIN-DM 100-10 MG/5ML PO SYRP
5.0000 mL | ORAL_SOLUTION | ORAL | Status: DC | PRN
  Filled 2013-12-06: qty 10

## 2013-12-06 MED ORDER — ALBUTEROL SULFATE (2.5 MG/3ML) 0.083% IN NEBU
2.5000 mg | INHALATION_SOLUTION | RESPIRATORY_TRACT | Status: DC | PRN
Start: 2013-12-06 — End: 2013-12-14
  Administered 2013-12-09: 2.5 mg via RESPIRATORY_TRACT
  Filled 2013-12-06: qty 3

## 2013-12-06 MED ORDER — ACETAMINOPHEN 325 MG PO TABS: 650.0000 mg | ORAL_TABLET | Freq: Four times a day (QID) | ORAL | Status: DC | PRN

## 2013-12-06 MED ORDER — INFLUENZA VAC SPLIT QUAD 0.5 ML IM SUSY
0.5000 mL | PREFILLED_SYRINGE | INTRAMUSCULAR | Status: AC
  Administered 2013-12-07: 0.5 mL via INTRAMUSCULAR
  Filled 2013-12-06 (×2): qty 0.5

## 2013-12-06 MED ORDER — CETYLPYRIDINIUM CHLORIDE 0.05 % MT LIQD
7.0000 mL | Freq: Two times a day (BID) | OROMUCOSAL | Status: DC
  Administered 2013-12-07 – 2013-12-14 (×12): 7 mL via OROMUCOSAL

## 2013-12-06 MED ORDER — CHLORHEXIDINE GLUCONATE 0.12 % MT SOLN
15.0000 mL | Freq: Two times a day (BID) | OROMUCOSAL | Status: DC
  Administered 2013-12-06 – 2013-12-14 (×16): 15 mL via OROMUCOSAL
  Filled 2013-12-06 (×21): qty 15

## 2013-12-06 MED ORDER — POLYETHYLENE GLYCOL 3350 17 G PO PACK
17.0000 g | PACK | Freq: Every day | ORAL | Status: DC | PRN
  Administered 2013-12-10: 17 g via ORAL
  Filled 2013-12-06 (×2): qty 1

## 2013-12-06 MED ORDER — PANTOPRAZOLE SODIUM 40 MG PO TBEC
40.0000 mg | DELAYED_RELEASE_TABLET | Freq: Two times a day (BID) | ORAL | Status: DC
  Administered 2013-12-06 – 2013-12-14 (×14): 40 mg via ORAL
  Filled 2013-12-06 (×18): qty 1

## 2013-12-06 MED ORDER — SODIUM CHLORIDE 0.9 % IJ SOLN
3.0000 mL | Freq: Two times a day (BID) | INTRAMUSCULAR | Status: DC
  Administered 2013-12-06 – 2013-12-13 (×7): 3 mL via INTRAVENOUS

## 2013-12-06 MED ORDER — ADULT MULTIVITAMIN W/MINERALS CH
1.0000 | ORAL_TABLET | ORAL | Status: DC
  Administered 2013-12-07 – 2013-12-14 (×6): 1 via ORAL
  Filled 2013-12-06 (×11): qty 1

## 2013-12-06 MED ORDER — SODIUM CHLORIDE 0.9 % IV SOLN
INTRAVENOUS | Status: DC
  Administered 2013-12-06 – 2013-12-07 (×3): via INTRAVENOUS

## 2013-12-06 MED ORDER — ENOXAPARIN SODIUM 30 MG/0.3ML ~~LOC~~ SOLN: 30.0000 mg | SUBCUTANEOUS | Status: DC

## 2013-12-06 MED ORDER — ACETAMINOPHEN 650 MG RE SUPP: 650.0000 mg | Freq: Four times a day (QID) | RECTAL | Status: DC | PRN

## 2013-12-06 MED ORDER — CLINDAMYCIN PHOSPHATE 600 MG/50ML IV SOLN
600.0000 mg | Freq: Once | INTRAVENOUS | Status: AC
  Administered 2013-12-06: 600 mg via INTRAVENOUS
  Filled 2013-12-06: qty 50

## 2013-12-06 MED ORDER — CEFEPIME HCL 1 G IJ SOLR
1.0000 g | INTRAMUSCULAR | Status: DC
  Administered 2013-12-06: 1 g via INTRAVENOUS
  Filled 2013-12-06 (×2): qty 1

## 2013-12-06 MED ORDER — BOOST / RESOURCE BREEZE PO LIQD
1.0000 | Freq: Three times a day (TID) | ORAL | Status: DC
  Administered 2013-12-06 – 2013-12-10 (×7): 1 via ORAL

## 2013-12-06 MED ORDER — IPRATROPIUM BROMIDE 0.02 % IN SOLN: 0.5000 mg | Freq: Four times a day (QID) | RESPIRATORY_TRACT | Status: DC | PRN

## 2013-12-06 MED ORDER — ALUM & MAG HYDROXIDE-SIMETH 200-200-20 MG/5ML PO SUSP: 30.0000 mL | Freq: Four times a day (QID) | ORAL | Status: DC | PRN

## 2013-12-06 MED ORDER — ENOXAPARIN SODIUM 40 MG/0.4ML ~~LOC~~ SOLN
40.0000 mg | SUBCUTANEOUS | Status: DC
  Administered 2013-12-06: 40 mg via SUBCUTANEOUS
  Filled 2013-12-06: qty 0.4

## 2013-12-06 MED ORDER — ATORVASTATIN CALCIUM 10 MG PO TABS
10.0000 mg | ORAL_TABLET | Freq: Every day | ORAL | Status: DC
  Administered 2013-12-07 – 2013-12-14 (×6): 10 mg via ORAL
  Filled 2013-12-06 (×11): qty 1

## 2013-12-06 NOTE — ED Notes (Signed)
Patient transported to X-ray 

## 2013-12-06 NOTE — ED Provider Notes (Signed)
CSN: [id number]     Arrival date & time 12/06/13  1041 History   First MD Initiated Contact with Patient 12/06/13 1043     Chief Complaint  Patient presents with  . Altered Mental Status  . Fall     (Consider location/radiation/quality/duration/timing/severity/associated sxs/prior Treatment) HPI  This is an 80 year old female with history hypertension, hyperlipidemia who presents with recurrent fall and altered mental status. Son is at the bedside. Patient reports that she fell twice this morning. She denies dizziness, chest pain, or shortness of breath. She states "I just feel weak." She denies hitting her head or loss of consciousness. She is awake, alert, and oriented 3. She denies any pain including hip pain, leg pain, chest pain, shortness of breath. Per the patient's son, she's had gradual decline over the last several weeks. He reports that she has a lot "slower" and weaker. She was unable to get herself into his car earlier today.  Past Medical History  Diagnosis Date  . Hypertension   . Hyperlipidemia   . Hydrocephalus 1990's  . Complication of anesthesia   . PONV (postoperative nausea and vomiting)   . Arthritis     hands   Past Surgical History  Procedure Laterality Date  . Csf shunt    . Fracture surgery      lt knee, rt elbow  . Esophagogastroduodenoscopy N/A 11/15/2013    Procedure: ESOPHAGOGASTRODUODENOSCOPY (EGD);  Surgeon: Louis Meckelobert D Kaplan, MD;  [facility];  Service: Endoscopy;  Laterality: N/A;   History reviewed. No pertinent family history. History  Substance Use Topics  . Smoking status: Never Smoker   . Smokeless tobacco: Never Used  . Alcohol Use: No   OB History    No data available     Review of Systems  Constitutional: Positive for fatigue. Negative for fever.  Respiratory: Negative for chest tightness and shortness of breath.   Cardiovascular: Negative for chest pain.  Gastrointestinal: Negative for nausea, vomiting and abdominal  pain.  Genitourinary: Negative for dysuria.  Musculoskeletal: Negative for back pain.  Skin: Negative for wound.  Neurological: Positive for weakness. Negative for dizziness, numbness and headaches.  Psychiatric/Behavioral: Positive for confusion.  All other systems reviewed and are negative.     Allergies  Review of patient's allergies indicates no known allergies.  Home Medications   Prior to Admission medications   Medication Sig Start Date End Date Taking? Authorizing Provider  ALPRAZolam Prudy Feeler(XANAX) 0.5 MG tablet Take 0.5 mg by mouth at bedtime as needed for anxiety.   Yes Historical Provider, MD  amLODipine (NORVASC) 2.5 MG tablet Take 2.5 mg by mouth every morning.  04/13/13  Yes Historical Provider, MD  atorvastatin (LIPITOR) 10 MG tablet Take 10 mg by mouth every morning.  04/13/13  Yes Historical Provider, MD  CALCIUM PO Take 1 tablet by mouth 2 (two) times daily.   Yes Historical Provider, MD  Fish Oil OIL Take 1 capsule by mouth every morning.    Yes Historical Provider, MD  losartan (COZAAR) 100 MG tablet Take 100 mg by mouth every morning.  04/13/13  Yes Historical Provider, MD  Multiple Vitamin (MULTIVITAMIN WITH MINERALS) TABS tablet Take 1 tablet by mouth every morning.    Yes Historical Provider, MD  pantoprazole (PROTONIX) 40 MG tablet Take 40 mg by mouth 2 (two) times daily.   Yes Historical Provider, MD  Multiple Vitamins-Minerals (ICAPS MV PO) Take 1 tablet by mouth every morning.     Historical Provider, MD   BP  128/37 mmHg  Pulse 140  Temp(Src) 98.9 F (37.2 C) (Oral)  Resp 26  Ht 5\' 6"  (1.676 m)  Wt 132 lb 4.4 oz (60 kg)  BMI 21.36 kg/m2  SpO2 89% Physical Exam  Constitutional: She is oriented to person, place, and time. No distress.  elderly  HENT:  Head: Normocephalic and atraumatic.  Mucous membranes dry  Cardiovascular: Normal rate, regular rhythm and normal heart sounds.   No murmur heard. Pulmonary/Chest: Effort normal and breath sounds normal. No  respiratory distress. She has no wheezes.  Abdominal: Soft. There is no tenderness.  Musculoskeletal: She exhibits no edema.  Neurological: She is alert and oriented to person, place, and time.  Cranial nerves II through XII intact, no dysmetria to finger-nose-finger, 5 out of 5 strength in all 4 extremities  Skin: Skin is warm and dry.  Psychiatric: She has a normal mood and affect.  Nursing note and vitals reviewed.   ED Course  Procedures (including critical care time)  CRITICAL CARE Performed by: Ross MarcusHORTON, COURTNEY, F   Total critical care time: 35 min  Critical care time was exclusive of separately billable procedures and treating other patients.  Critical care was necessary to treat or prevent imminent or life-threatening deterioration.  Critical care was time spent personally by me on the following activities: development of treatment plan with patient and/or surrogate as well as nursing, discussions with consultants, evaluation of patient's response to treatment, examination of patient, obtaining history from patient or surrogate, ordering and performing treatments and interventions, ordering and review of laboratory studies, ordering and review of radiographic studies, pulse oximetry and re-evaluation of patient's condition.  Labs Review Labs Reviewed  CBC WITH DIFFERENTIAL - Abnormal; Notable for the following:    WBC 19.8 (*)    RBC 3.62 (*)    Hemoglobin 11.4 (*)    HCT 33.5 (*)    Neutrophils Relative % 86 (*)    Lymphocytes Relative 4 (*)    Neutro Abs 17.0 (*)    Monocytes Absolute 2.0 (*)    All other components within normal limits  BASIC METABOLIC PANEL - Abnormal; Notable for the following:    Sodium 136 (*)    Glucose, Bld 198 (*)    BUN 26 (*)    Creatinine, Ser 1.53 (*)    GFR calc non Af Amer 29 (*)    GFR calc Af Amer 34 (*)    All other components within normal limits  URINALYSIS, ROUTINE W REFLEX MICROSCOPIC - Abnormal; Notable for the following:     Color, Urine ORANGE (*)    APPearance TURBID (*)    Protein, ur 100 (*)    Leukocytes, UA SMALL (*)    All other components within normal limits  URINE MICROSCOPIC-ADD ON - Abnormal; Notable for the following:    Bacteria, UA FEW (*)    Casts HYALINE CASTS (*)    All other components within normal limits  BASIC METABOLIC PANEL - Abnormal; Notable for the following:    Glucose, Bld 139 (*)    GFR calc non Af Amer 46 (*)    GFR calc Af Amer 53 (*)    All other components within normal limits  CBC - Abnormal; Notable for the following:    WBC 18.2 (*)    RBC 3.54 (*)    Hemoglobin 11.0 (*)    HCT 33.2 (*)    All other components within normal limits  MRSA PCR SCREENING  CULTURE, BLOOD (ROUTINE X 2)  CULTURE, BLOOD (ROUTINE X 2)  URINE CULTURE  POC OCCULT BLOOD, ED  I-STAT CG4 LACTIC ACID, ED    Imaging Review Dg Chest 2 View  12/06/2013   CLINICAL DATA:  Altered mental status. Recent fall with slurred speech.  EXAM: CHEST  2 VIEW  COMPARISON:  One-view chest 11/14/2013  FINDINGS: The heart size is normal. Lung volumes are low. A superior segment right lower lobe pneumonia is evident. There is mild diffuse edema. New small right pleural effusion is evident. Atherosclerotic calcifications noted in the aortic arch. Shunt tubing is evident on the patient's known ventriculostomy.  IMPRESSION: 1. New superior segment right lower lobe pneumonia. 2. Mild interstitial edema.   Electronically Signed   By: Gennette Pac M.D.   On: 12/06/2013 13:02   Ct Head Wo Contrast  12/06/2013   CLINICAL DATA:  80 year old female with 2 falls this morning. Confusion. Hypertension. Hydrocephalus. Initial encounter.  EXAM: CT HEAD WITHOUT CONTRAST  TECHNIQUE: Contiguous axial images were obtained from the base of the skull through the vertex without intravenous contrast.  COMPARISON:  04/14/2013.  FINDINGS: Post left occipital craniectomy. Postoperative cerebral spinal fluid collection at the craniectomy  site appears similar to the prior exam. Shunt catheter traverses partially through this collection.  Left parietal shunt catheter extends into left lateral ventricle impressing upon the septum unchanged in position. Right frontal shunt catheter with the tip at the level of the frontal horn unchanged in position. No development of hydrocephalus or slit like ventricles.  No skull fracture is detected.  Extra-axial collection has an appearance more suggestive of a dural thickening rather than result of recent fall and is without change from the prior examination. Overall, no acute intracranial hemorrhage detected.  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  No CT evidence of large acute infarct.  No intracranial mass lesion noted on this unenhanced exam.  IMPRESSION: No acute intracranial abnormality noted.  Post left occipital craniectomy. Postoperative cerebral spinal fluid collection at the craniectomy site appears similar to the prior exam. Shunt catheter traverses partially through this collection.  Left parietal shunt catheter extends into left lateral ventricle impressing upon the septum unchanged in position. Right frontal shunt catheter with the tip at the level of the frontal horn unchanged in position. No development of hydrocephalus or slit like ventricles.  No skull fracture is detected.  Extra-axial collection has an appearance more suggestive of a dural thickening rather than result of recent fall and is without change from the prior examination. Overall, no acute intracranial hemorrhage detected.  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  No CT evidence of large acute infarct.   Electronically Signed   By: Bridgett Larsson M.D.   On: 12/06/2013 11:56     EKG Interpretation None     EKG independently reviewed by myself: Normal sinus rhythm with a rate of 84, no evidence of acute ischemia MDM    Final diagnoses:  Fall  Altered mental status    Patient patient presents following a fall. Son reports increasing confusion over the last several weeks. Had a food impaction several weeks ago prior retrieval. Stones states that she is on a soft diet. She is afebrile and initial vital signs reassuring. Initial workup notable for white count of 19.5 with no evidence of urinary tract infection. CT scan shows no evidence of traumatic injury. Will add chest x-ray given leukocytosis. While patient was in the ER, blood pressures  trended downward. She was given 1 L normal saline. Remained afebrile. Lactate is normal.  Chest x-ray shows a right lower lobe pneumonia. Given recent dysphagia and food impaction, suspect aspiration. Patient was given clindamycin and cefepime per her primary physician. Blood cultures were added to patient workup.   Patient to be admitted to Dr. Jarold Motto service.    Shon Baton, MD 12/07/13 [phone]

## 2013-12-06 NOTE — Progress Notes (Signed)
ANTIBIOTIC CONSULT NOTE - INITIAL  Pharmacy Consult for Cefepime Indication: aspiration pneumonia/HCAP  No Known Allergies  Patient Measurements:    Vital Signs: Temp: 97.7 F (36.5 C) (11/03 1305) Temp Source: Rectal (11/03 1305) BP: 92/46 mmHg (11/03 1235) Pulse Rate: 83 (11/03 1230) Intake/Output from previous day:   Intake/Output from this shift:    Labs:  Recent Labs  12/06/13 1110  WBC 19.8*  HGB 11.4*  PLT 310  CREATININE 1.53*   CrCl cannot be calculated (Unknown ideal weight.). No results for input(s): VANCOTROUGH, VANCOPEAK, VANCORANDOM, GENTTROUGH, GENTPEAK, GENTRANDOM, TOBRATROUGH, TOBRAPEAK, TOBRARND, AMIKACINPEAK, AMIKACINTROU, AMIKACIN in the last 72 hours.   Microbiology: No results found for this or any previous visit (from the past 720 hour(s)).  Medical History: Past Medical History  Diagnosis Date  . Hypertension   . Hyperlipidemia   . Hydrocephalus 1990's  . Complication of anesthesia   . PONV (postoperative nausea and vomiting)   . Arthritis     hands    Medications:   (Not in a hospital admission)  Assessment: 80yo F w/ weakness, recurrent falls, and AMS. Hx dysphagia, underwent EGD for food disimpaction on 11/15/13. Pharmacy is asked to dose cefepime for suspected aspiration pna/HCAP.  11/3 >> Clinda x 1 11/3 >> Cefepime >>    Tmax: AF WBCs: elevated Renal: SCr 1.53, 28N   Goal of Therapy:  Appropriate antibiotic dosing for renal function; eradication of infection  Plan:   Cefepime 1g IV q24h.  Follow up renal fxn and culture results.  Charolotte Ekeom Stacey Sago, PharmD, pager [phone]. 12/06/2013,1:58 PM.

## 2013-12-06 NOTE — Progress Notes (Signed)
Utilization Review completed.  Glennie Rodda RN CM  

## 2013-12-06 NOTE — Progress Notes (Signed)
PHARMACY - LOVENOX  Pharmacy was requested to adjust Lovenox dose as needed for VTE prophylaxis.   Currently on Lovenox 40mg  sq daily.  Scr = 1.53 CrCl = 23 ml/min  Assessment:  Lovenox to be adjusted as CrCl < 30 ml/min  Plan:  Change Lovenox to 30mg  sq q24h  Terrilee FilesLeann Frannie Shedrick, PharmD

## 2013-12-06 NOTE — ED Notes (Signed)
Son states pt balance and strength is weaker recently

## 2013-12-06 NOTE — H&P (Signed)
PCP:   No primary care provider on file.   Chief Complaint:  weakness  HPI: Katrina Flynn is an 80 year old white female with a history of hydrocephalus status post VP shunt, chronic gait instability who presented to the emergency department with increased weakness and fall 2. She's had a progressive decline in her functional status over the past several weeks. This started about 3-4 weeks ago when she choked while eating dinner with possible aspiration.  She underwent an EGD on 10/13 that showed an esophageal stricture with retained food extraction- started on soft diet with plans to dilate the esophagus soon.  Her son reports poor po intake since that time.  Had some blood tinged sputum which has resolved.  No cough. She has had a progressive weakness prompting them to start PT at [facility] last week.  Despite this, she continued to become increasingly weak and fell twice this morning.  Her son also felt like she seemed more lethargic with increased confusion, issues with her balance, and possible slurred speech.  No focal weakness but was concerned about the possibility of a stroke so he brought her to the emergency department. In the emergency department her head CT shows no acute abnormalities or intracranial hemorrhage. Additional workup revealed an elevated white blood count,probable right lower lobe pneumonia on chest x-ray.  In the emergency department her blood pressure dropped to 89/43.  Due to hypotension she was admitted to a stepdown unit for treatment. She was given a dose of Clindamycin andd cefepime in the emergency department.  Review of Systems:  Review of Systems - all systems reviewed with the patient and are negative as in history of present illness.  Past Medical History: HTN Osteoporosis Subdural Hematuria/Hydrocephalus Gait Instability, uses walker with a seat at all times OA, hips Transient Microhematuria Hyperlipidemia Overactive Bladder, declines med  Physicians  involved in care: Blima Ledger, Annett Fabian   Past Surgical History  Procedure Laterality Date  . Csf shunt    . Fracture surgery      lt knee, rt elbow  . Esophagogastroduodenoscopy N/A 11/15/2013    Procedure: ESOPHAGOGASTRODUODENOSCOPY (EGD);  Surgeon: Louis Meckel, MD;  Location: [facility];  Service: Endoscopy;  Laterality: N/A;   1960, 1988  VP shunt 1/03 VP shunt eval (-)  Medications: Prior to Admission medications   Medication Sig Start Date End Date Taking? Authorizing Provider  ALPRAZolam Prudy Feeler) 0.5 MG tablet Take 0.5 mg by mouth at bedtime as needed for anxiety.   Yes Historical Provider, MD  amLODipine (NORVASC) 2.5 MG tablet Take 2.5 mg by mouth every morning.  04/13/13  Yes Historical Provider, MD  atorvastatin (LIPITOR) 10 MG tablet Take 10 mg by mouth every morning.  04/13/13  Yes Historical Provider, MD  CALCIUM PO Take 1 tablet by mouth 2 (two) times daily.   Yes Historical Provider, MD  Fish Oil OIL Take 1 capsule by mouth every morning.    Yes Historical Provider, MD  losartan (COZAAR) 100 MG tablet Take 100 mg by mouth every morning.  04/13/13  Yes Historical Provider, MD  Multiple Vitamin (MULTIVITAMIN WITH MINERALS) TABS tablet Take 1 tablet by mouth every morning.    Yes Historical Provider, MD  pantoprazole (PROTONIX) 40 MG tablet Take 40 mg by mouth 2 (two) times daily.   Yes Historical Provider, MD  Multiple Vitamins-Minerals (ICAPS MV PO) Take 1 tablet by mouth every morning.     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 [facility]. [facility] and her [facility] due to gait instability.   Family History: Father (D) [id number], Pancreatic CA Mother (D) [id number] Siblings, 1 sister (D) [id number]-Unknown cause Children, 4 sons (1 D 36-Liver  Disease)  Physical Exam: Filed Vitals:   12/06/13 1413 12/06/13 1435 12/06/13 1437 12/06/13 1500  BP:  127/51    Pulse:  70 87   Temp:      TempSrc:      Resp: 20 32 25   Height:    5\' 6"  (1.676 m)  Weight:    61.8 kg (136 lb 3.9 oz)  SpO2:  87% 97%    General appearance: alert and no distress Head: Normocephalic, without obvious abnormality, atraumatic Eyes: conjunctivae/corneas clear. PERRL, EOM's intact.  Nose: Nares normal. Septum midline. Mucosa normal. No drainage or sinus tenderness. Throat: lips, mucosa, and tongue normal; teeth and gums normal Neck: no adenopathy, no carotid bruit, no JVD and thyroid not enlarged, symmetric, no tenderness/mass/nodules Resp: clear to auscultation bilaterally Cardio: irregular heart rate with frequent ectopy GI: soft, non-tender; bowel sounds normal; no masses,  no organomegaly Extremities: extremities normal, atraumatic, no cyanosis or edema Pulses: 2+ and symmetric Lymph nodes: no cervical lymphadenopathy Neurologic: Alert and oriented X 3, normal strength and tone.      Labs on Admission:   Recent Labs  12/06/13 1110  NA 136*  K 4.6  CL 97  CO2 24  GLUCOSE 198*  BUN 26*  CREATININE 1.53*  CALCIUM 9.3    Recent Labs  12/06/13 1110  WBC 19.8*  NEUTROABS 17.0*  HGB 11.4*  HCT 33.5*  MCV 92.5  PLT 310    Radiological Exams on Admission: Dg Chest 2 View  12/06/2013   CLINICAL DATA:  Altered mental status. Recent fall with slurred speech.  EXAM: CHEST  2 VIEW  COMPARISON:  One-view chest 11/14/2013  FINDINGS: The heart size is normal. Lung volumes are low. A superior segment right lower lobe pneumonia is evident. There is mild diffuse edema. New small right pleural effusion is evident. Atherosclerotic calcifications noted in the aortic arch. Shunt tubing is evident on the patient's known ventriculostomy.  IMPRESSION: 1. New superior segment right lower lobe pneumonia. 2. Mild interstitial edema.   Electronically Signed    By: Gennette Pachris  Mattern M.D.   On: 12/06/2013 13:02   Ct Head Wo Contrast  12/06/2013   CLINICAL DATA:  80 year old female with 2 falls this morning. Confusion. Hypertension. Hydrocephalus. Initial encounter.  EXAM: CT HEAD WITHOUT CONTRAST  TECHNIQUE: Contiguous axial images were obtained from the base of the skull through the vertex without intravenous contrast.  COMPARISON:  04/14/2013.  FINDINGS: Post left occipital craniectomy. Postoperative cerebral spinal fluid collection at the craniectomy site appears similar to the prior exam. Shunt catheter traverses partially through this collection.  Left parietal shunt catheter extends into left lateral ventricle impressing upon the septum unchanged in position. Right frontal shunt catheter with the tip at the level of the frontal horn unchanged in position. No development of hydrocephalus or slit like ventricles.  No skull fracture is detected.  Extra-axial collection has an appearance more suggestive of a dural thickening rather than result of recent fall and is without change from the prior examination. Overall, no acute intracranial hemorrhage detected.  Encephalomalacia left  posterior frontal -parietal lobe suggestive of result of prior infarct.  No CT evidence of large acute infarct.  No intracranial mass lesion noted on this unenhanced exam.  IMPRESSION: No acute intracranial abnormality noted.  Post left occipital craniectomy. Postoperative cerebral spinal fluid collection at the craniectomy site appears similar to the prior exam. Shunt catheter traverses partially through this collection.  Left parietal shunt catheter extends into left lateral ventricle impressing upon the septum unchanged in position. Right frontal shunt catheter with the tip at the level of the frontal horn unchanged in position. No development of hydrocephalus or slit like ventricles.  No skull fracture is detected.  Extra-axial collection has an appearance more suggestive of a dural  thickening rather than result of recent fall and is without change from the prior examination. Overall, no acute intracranial hemorrhage detected.  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  No CT evidence of large acute infarct.   Electronically Signed   By: Bridgett LarssonSteve  Olson M.D.   On: 12/06/2013 11:56   Dg Chest Port 1 View  11/14/2013   CLINICAL DATA:  Eating porch op and feels as of something is stuck in her throat. Now coughing and spitting.  EXAM: PORTABLE CHEST - 1 VIEW  COMPARISON:  None.  FINDINGS: Normal cardiac silhouette and mediastinal contours with atherosclerotic plaque within the thoracic aorta. The lungs are hyperexpanded. Minimal bibasilar heterogeneous opacities, right greater than left, likely atelectasis. No discrete focal airspace opacities. No pleural effusion or pneumothorax. No evidence of edema. A presumed ventriculostomy catheter courses along the medial aspect of the right midclavicular line.  No definite radiopaque foreign body.  No acute osseus abnormalities.  IMPRESSION: Hyperexpanded lungs and bibasilar atelectasis without acute cardiopulmonary disease. Specifically, no definite radiopaque foreign body.   Electronically Signed   By: Simonne ComeJohn  Watts M.D.   On: 11/14/2013 22:34   Orders placed or performed during the hospital encounter of 12/06/13  . ED EKG  . ED EKG    Assessment/Plan 1. Generalized weakness with recurrent falls-multifactorial secondary to relative hypotension in the setting of poor by mouth intake, antihypertensive therapy, and volume depletion. Pneumonia may be contributing. Will admit for IV fluid hydration, hold antihypertensives, and antibiotics for pneumonia. PT/OT evaluation. Fall precautions. No focal neurologic deficits or CT abnormalities to warrant additional neurologic evaluation. 2. Pneumonia-leukocytosis and chest x-ray shows right lower lobe infiltrate consistent with pneumonia. This is possibly an  aspiration event in the setting of recent choking while eating. Will cover with cefepime and transitioned to oral alternative (possibly Levaquin) if stable.  Speech Therapy evaluation. 3. Acute renal failure-elevated BUN/creatinine consistent with prerenal azotemia. Will monitor with hydration. 4. Hypotension-transient hypotension is likely secondary to volume depletion. Blood cultures have been obtained but no other evidence of sepsis. 5. Esophageal stricture-recent EGD with extraction.planning for esophageal dilatation when stable. Continue Protonix and soft diet. 6. Protein-calorie malnutrition-she has had poor by mouth intake since her EGD. Will add resource breeze and encourage by mouth intake. Nutrition consult. 7. Disposition-[facility] when stable. Physical therapy/occupational therapy consults.   Katrina Flynn, Katrina Flynn 12/06/2013, 5:04 PM

## 2013-12-06 NOTE — ED Notes (Signed)
Patient was educated not to drive, operate heavy machinery, or drink alcohol while taking narcotic medication.  

## 2013-12-06 NOTE — ED Notes (Signed)
Pt resident at [facility]; had two falls this morning; son states increased confusion and not her normal self; pt oriented to person/place/time/events of morning; calm and cooperative; pt denies any c/o pain at present

## 2013-12-07 ENCOUNTER — Other Ambulatory Visit: Payer: Self-pay

## 2013-12-07 DIAGNOSIS — J690 Pneumonitis due to inhalation of food and vomit: Secondary | ICD-10-CM | POA: Diagnosis not present

## 2013-12-07 LAB — CBC
HCT: 33.2 % — ABNORMAL LOW (ref 36.0–46.0)
HEMOGLOBIN: 11 g/dL — AB (ref 12.0–15.0)
MCH: 31.1 pg (ref 26.0–34.0)
MCHC: 33.1 g/dL (ref 30.0–36.0)
MCV: 93.8 fL (ref 78.0–100.0)
Platelets: 296 10*3/uL (ref 150–400)
RBC: 3.54 MIL/uL — AB (ref 3.87–5.11)
RDW: 12.5 % (ref 11.5–15.5)
WBC: 18.2 10*3/uL — ABNORMAL HIGH (ref 4.0–10.5)

## 2013-12-07 LAB — URINE CULTURE
CULTURE: NO GROWTH
Colony Count: NO GROWTH

## 2013-12-07 LAB — BASIC METABOLIC PANEL
Anion gap: 12 (ref 5–15)
BUN: 20 mg/dL (ref 6–23)
CHLORIDE: 104 meq/L (ref 96–112)
CO2: 24 mEq/L (ref 19–32)
CREATININE: 1.05 mg/dL (ref 0.50–1.10)
Calcium: 8.4 mg/dL (ref 8.4–10.5)
GFR calc non Af Amer: 46 mL/min — ABNORMAL LOW (ref >90)
GFR, EST AFRICAN AMERICAN: 53 mL/min — AB (ref >90)
GLUCOSE: 139 mg/dL — AB (ref 70–99)
POTASSIUM: 4 meq/L (ref 3.7–5.3)
Sodium: 140 mEq/L (ref 137–147)

## 2013-12-07 MED ORDER — ENOXAPARIN SODIUM 40 MG/0.4ML ~~LOC~~ SOLN
40.0000 mg | SUBCUTANEOUS | Status: DC
  Administered 2013-12-07 – 2013-12-11 (×5): 40 mg via SUBCUTANEOUS
  Filled 2013-12-07 (×8): qty 0.4

## 2013-12-07 MED ORDER — CEFEPIME HCL 1 G IJ SOLR
1.0000 g | Freq: Two times a day (BID) | INTRAMUSCULAR | Status: DC
  Administered 2013-12-07 – 2013-12-13 (×13): 1 g via INTRAVENOUS
  Filled 2013-12-07 (×17): qty 1

## 2013-12-07 MED ORDER — BENEPROTEIN PO POWD
1.0000 | Freq: Three times a day (TID) | ORAL | Status: DC
  Filled 2013-12-07: qty 227

## 2013-12-07 MED ORDER — DILTIAZEM HCL 100 MG IV SOLR
10.0000 mg/h | INTRAVENOUS | Status: DC
  Administered 2013-12-07: 5 mg/h via INTRAVENOUS
  Administered 2013-12-08 (×2): 10 mg/h via INTRAVENOUS
  Administered 2013-12-10: 5 mg/h via INTRAVENOUS
  Filled 2013-12-07 (×3): qty 100

## 2013-12-07 MED ORDER — RESOURCE INSTANT PROTEIN PO PWD PACKET
6.0000 g | Freq: Three times a day (TID) | ORAL | Status: DC
  Administered 2013-12-07 – 2013-12-14 (×17): 6 g via ORAL
  Filled 2013-12-07 (×25): qty 6

## 2013-12-07 NOTE — Progress Notes (Signed)
02 2L/min [place] applied per MD care order.

## 2013-12-07 NOTE — Progress Notes (Signed)
Subjective: Feels weak, no dyspnea on room air.  Objective: Vital signs in last 24 hours: Temp:  [97.3 F (36.3 C)-99.1 F (37.3 C)] 98.9 F (37.2 C) (11/04 0400) Pulse Rate:  [31-140] 140 (11/04 0700) Resp:  [20-35] 26 (11/04 0700) BP: (89-145)/(36-86) 128/37 mmHg (11/04 0206) SpO2:  [87 %-97 %] 89 % (11/04 0700) Weight:  [60 kg (132 lb 4.4 oz)-61.8 kg (136 lb 3.9 oz)] 60 kg (132 lb 4.4 oz) (11/04 0400) Weight change:    Intake/Output from previous day: 11/03 0701 - 11/04 0700 In: 1645.8 [I.V.:1595.8; IV Piggyback:50] Out: -    General appearance: alert, cooperative and no distress Resp: clear to auscultation bilaterally and anteriorly Cardio: regular rate and rhythm GI: soft, non-tender; bowel sounds normal; no masses,  no organomegaly Extremities: extremities normal, atraumatic, no cyanosis or edema  Lab Results:  Recent Labs  12/06/13 1110 12/07/13 0318  WBC 19.8* 18.2*  HGB 11.4* 11.0*  HCT 33.5* 33.2*  PLT 310 296   BMET  Recent Labs  12/06/13 1110 12/07/13 0318  NA 136* 140  K 4.6 4.0  CL 97 104  CO2 24 24  GLUCOSE 198* 139*  BUN 26* 20  CREATININE 1.53* 1.05  CALCIUM 9.3 8.4   CMET CMP     Component Value Date/Time   NA 140 12/07/2013 0318   K 4.0 12/07/2013 0318   CL 104 12/07/2013 0318   CO2 24 12/07/2013 0318   GLUCOSE 139* 12/07/2013 0318   BUN 20 12/07/2013 0318   CREATININE 1.05 12/07/2013 0318   CALCIUM 8.4 12/07/2013 0318   GFRNONAA 46* 12/07/2013 0318   GFRAA 53* 12/07/2013 0318    CBG (last 3)  No results for input(s): GLUCAP in the last 72 hours.  INR RESULTS:  No results found for: INR, PROTIME   Studies/Results: Dg Chest 2 View  12/06/2013   CLINICAL DATA:  Altered mental status. Recent fall with slurred speech.  EXAM: CHEST  2 VIEW  COMPARISON:  One-view chest 11/14/2013  FINDINGS: The heart size is normal. Lung volumes are low. A superior segment right lower lobe pneumonia is evident. There is mild diffuse edema.  New small right pleural effusion is evident. Atherosclerotic calcifications noted in the aortic arch. Shunt tubing is evident on the patient's known ventriculostomy.  IMPRESSION: 1. New superior segment right lower lobe pneumonia. 2. Mild interstitial edema.   Electronically Signed   By: Gennette Pachris  Mattern M.D.   On: 12/06/2013 13:02   Ct Head Wo Contrast  12/06/2013   CLINICAL DATA:  80 year old female with 2 falls this morning. Confusion. Hypertension. Hydrocephalus. Initial encounter.  EXAM: CT HEAD WITHOUT CONTRAST  TECHNIQUE: Contiguous axial images were obtained from the base of the skull through the vertex without intravenous contrast.  COMPARISON:  04/14/2013.  FINDINGS: Post left occipital craniectomy. Postoperative cerebral spinal fluid collection at the craniectomy site appears similar to the prior exam. Shunt catheter traverses partially through this collection.  Left parietal shunt catheter extends into left lateral ventricle impressing upon the septum unchanged in position. Right frontal shunt catheter with the tip at the level of the frontal horn unchanged in position. No development of hydrocephalus or slit like ventricles.  No skull fracture is detected.  Extra-axial collection has an appearance more suggestive of a dural thickening rather than result of recent fall and is without change from the prior examination. Overall, no acute intracranial hemorrhage detected.  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  No  CT evidence of large acute infarct.  No intracranial mass lesion noted on this unenhanced exam.  IMPRESSION: No acute intracranial abnormality noted.  Post left occipital craniectomy. Postoperative cerebral spinal fluid collection at the craniectomy site appears similar to the prior exam. Shunt catheter traverses partially through this collection.  Left parietal shunt catheter extends into left lateral ventricle impressing upon the septum  unchanged in position. Right frontal shunt catheter with the tip at the level of the frontal horn unchanged in position. No development of hydrocephalus or slit like ventricles.  No skull fracture is detected.  Extra-axial collection has an appearance more suggestive of a dural thickening rather than result of recent fall and is without change from the prior examination. Overall, no acute intracranial hemorrhage detected.  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  No CT evidence of large acute infarct.   Electronically Signed   By: Bridgett LarssonSteve  Olson M.D.   On: 12/06/2013 11:56    Medications: I have reviewed the patient's current medications.  Assessment/Plan: #1 Pneumonia: stable on IV antibiotics. No dyspnea with mild hypoxia, and will add nasal cannula oxygen. #2 Dysphagia: moderate and likely led to protein calorie malnutrition and dehydration. Will consider barium swallow test versus GI consult for EGD with stricture dilatation depending on how quickly she improves #3 Tachycardia: telemetry shows sinus tachycardia, now at heart rate 100-110.   LOS: 1 day   Lior Cartelli G 12/07/2013, 7:42 AM

## 2013-12-07 NOTE — Progress Notes (Signed)
CSW received consult that [facility] admitted from [facility].   CSW contacted [facility] to clarify and per facility, pt admitted from [facility] at [facility].  CSW to await PT/OT consults to determine if pt needs assistance from CSW for disposition planning.  Loletta SpecterSuzanna Kidd, MSW, LCSW Clinical Social Work [phone]

## 2013-12-07 NOTE — Evaluation (Signed)
Physical Therapy Evaluation Patient Details Name: Katrina Flynn MRN: [id number] DOB: [birth year] Today's Date: 12/07/2013   History of Present Illness  80 yo female admitted with pna, falls, hypotension. Hx of hydrocephalus, VP shunt, chronic gait instability, osteoporosis. Pt is from Ind Living,.   Clinical Impression  On eval, pt required Mod assist +2 for mobility-able to ambulate a short distance ~5 feet with rolling walker. Significantly unstable/unsteady with ambulation-at high risk for falls. Recommend ST rehab at SNF.     Follow Up Recommendations SNF    Equipment Recommendations  Rolling walker with 5" wheels    Recommendations for Other Services OT consult     Precautions / Restrictions Precautions Precautions: Fall Restrictions Weight Bearing Restrictions: No      Mobility  Bed Mobility Overal bed mobility: Needs Assistance Bed Mobility: Supine to Sit     Supine to sit: HOB elevated;Mod assist     General bed mobility comments: Assist for trunk and bil LEs. Increased time.   Transfers Overall transfer level: Needs assistance Equipment used: Rolling walker (2 wheeled) Transfers: Sit to/from Stand Sit to Stand: Mod assist;+2 physical assistance;+2 safety/equipment;From elevated surface         General transfer comment: Assist to rise, stabilize, control descent. Multimodal cues for safety, technique, hand placement. Leaning heavily in posterior direction.   Ambulation/Gait Ambulation/Gait assistance: Mod assist;+2 physical assistance;+2 safety/equipment Ambulation Distance (Feet): 5 Feet Assistive device: Rolling walker (2 wheeled) Gait Pattern/deviations: Step-to pattern;Decreased stride length     General Gait Details: Assist to support/stabilize pt and to maneuver with RW. Very unsteady. WEight shifted posteriorly onto heels. Fatigues very quickly. Heightened anxiety about falling  Stairs            Wheelchair Mobility    Modified Rankin  (Stroke Patients Only)       Balance Overall balance assessment: Needs assistance Sitting-balance support: Bilateral upper extremity supported;Feet supported Sitting balance-Leahy Scale: Fair     Standing balance support: Bilateral upper extremity supported;During functional activity Standing balance-Leahy Scale: Poor                               Pertinent Vitals/Pain Pain Assessment: No/denies pain    Home Living Family/patient expects to be discharged to:: Private residence Living Arrangements: Alone   Type of Home: Independent living facility Home Access: Elevator     Home Layout: One level Home Equipment: Environmental consultantWalker - 4 wheels      Prior Function Level of Independence: Independent with assistive device(s)         Comments: uses rollator. pt was able to perform some household chores and prepare breakfast. Walked to dining room for lunch/dinner.      Hand Dominance        Extremity/Trunk Assessment   Upper Extremity Assessment: Defer to OT evaluation           Lower Extremity Assessment: Generalized weakness      Cervical / Trunk Assessment: Kyphotic  Communication   Communication: No difficulties  Cognition Arousal/Alertness: Awake/alert Behavior During Therapy: WFL for tasks assessed/performed Overall Cognitive Status: Within Functional Limits for tasks assessed                      General Comments      Exercises General Exercises - Lower Extremity Heel Raises: AROM;Both;5 reps;Standing      Assessment/Plan    PT Assessment Patient needs continued PT services  PT Diagnosis  Difficulty walking;Abnormality of gait;Generalized weakness   PT Problem List Decreased strength;Decreased activity tolerance;Decreased balance;Decreased mobility;Decreased knowledge of use of DME  PT Treatment Interventions DME instruction;Gait training;Functional mobility training;Therapeutic activities;Therapeutic exercise;Patient/family  education;Balance training   PT Goals (Current goals can be found in the Care Plan section) Acute Rehab PT Goals Patient Stated Goal: none stated PT Goal Formulation: With patient Time For Goal Achievement: 12/21/13 Potential to Achieve Goals: Good    Frequency Min 3X/week   Barriers to discharge        Co-evaluation               End of Session Equipment Utilized During Treatment: Gait belt Activity Tolerance: Patient limited by fatigue Patient left: in chair;with call bell/phone within reach;with family/visitor present           Time: 1035-1055 PT Time Calculation (min): 20 min   Charges:   PT Evaluation $Initial PT Evaluation Tier I: 1 Procedure PT Treatments $Gait Training: 8-22 mins   PT G Codes:          Rebeca AlertJannie Asaiah Hunnicutt, MPT Pager: [phone]

## 2013-12-07 NOTE — Progress Notes (Signed)
SPEECH PATHOLOGY   Phoned Dr. Silvano RuskPaterson's office re: timing of esophageal dilitation vs. MBS.  MD agrees that dilitation prior to further swallow evaluation would be most beneficial, as a primary esophageal dysphagia is likely the source of pharyngeal symptoms.  SLP will recheck after GI for possible need for MBS after dilitation.  Thanks, Katrina RochesterLori Tynetta Flynn, CCCSLP

## 2013-12-07 NOTE — Progress Notes (Signed)
ANTIBIOTIC CONSULT NOTE   Pharmacy Consult for Cefepime Indication: aspiration pneumonia/HCAP  No Known Allergies  Patient Measurements: Height: 5\' 6"  (167.6 cm) Weight: 132 lb 4.4 oz (60 kg) IBW/kg (Calculated) : 59.3  Vital Signs: Temp: 97.7 F (36.5 C) (11/04 0800) Temp Source: Oral (11/04 0800) BP: 116/47 mmHg (11/04 0800) Pulse Rate: 97 (11/04 0900) Intake/Output from previous day: 11/03 0701 - 11/04 0700 In: 1895.8 [I.V.:1845.8; IV Piggyback:50] Out: -  Intake/Output from this shift: Total I/O In: 240 [P.O.:240] Out: -   Labs:  Recent Labs  12/06/13 1110 12/07/13 0318  WBC 19.8* 18.2*  HGB 11.4* 11.0*  PLT 310 296  CREATININE 1.53* 1.05   Estimated Creatinine Clearance: 34 mL/min (by C-G formula based on Cr of 1.05). No results for input(s): VANCOTROUGH, VANCOPEAK, VANCORANDOM, GENTTROUGH, GENTPEAK, GENTRANDOM, TOBRATROUGH, TOBRAPEAK, TOBRARND, AMIKACINPEAK, AMIKACINTROU, AMIKACIN in the last 72 hours.   Microbiology: Recent Results (from the past 720 hour(s))  Culture, blood (routine x 2)     Status: None (Preliminary result)   Collection Time: 12/06/13  1:12 PM  Result Value Ref Range Status   Specimen Description BLOOD RIGHT FOREARM  Final   Special Requests BOTTLES DRAWN AEROBIC AND ANAEROBIC 4ML  Final   Culture  Setup Time   Final    12/06/2013 18:13 Performed at [facility]    Culture   Final           BLOOD CULTURE RECEIVED NO GROWTH TO DATE CULTURE WILL BE HELD FOR 5 DAYS BEFORE ISSUING A FINAL NEGATIVE REPORT Performed at [facility]    Report Status PENDING  Incomplete  Culture, blood (routine x 2)     Status: None (Preliminary result)   Collection Time: 12/06/13  1:15 PM  Result Value Ref Range Status   Specimen Description BLOOD RIGHT ANTECUBITAL  Final   Special Requests BOTTLES DRAWN AEROBIC AND ANAEROBIC 4ML  Final   Culture  Setup Time   Final    12/06/2013 18:15 Performed at [facility]    Culture    Final           BLOOD CULTURE RECEIVED NO GROWTH TO DATE CULTURE WILL BE HELD FOR 5 DAYS BEFORE ISSUING A FINAL NEGATIVE REPORT Performed at [facility]    Report Status PENDING  Incomplete  MRSA PCR Screening     Status: None   Collection Time: 12/06/13  2:37 PM  Result Value Ref Range Status   MRSA by PCR NEGATIVE NEGATIVE Final    Comment:        The GeneXpert MRSA Assay (FDA approved for NASAL specimens only), is one component of a comprehensive MRSA colonization surveillance program. It is not intended to diagnose MRSA infection nor to guide or monitor treatment for MRSA infections.     Assessment: 80yo F w/ weakness, recurrent falls, and AMS. Hx dysphagia, underwent EGD for food disimpaction on 11/15/13. Pharmacy is asked to dose cefepime for suspected aspiration pna/HCAP.  11/3 >> Clinda x 1 11/3 >> Cefepime >>   Tmax: AF WBCs: elevated/unchanged Renal: SCr now WNL  11/3 Blood: NGTD 11/3 urine:   Goal of Therapy:  Appropriate antibiotic dosing for renal function; eradication of infection  Plan:   For improved renal function adjust Cefepime 1g IV q12h.  Follow up renal fxn and culture results.  Also, CrCl now > 5830ml/min - change enoxaparin to 40mg  SQ q24h  Juliette Alcideustin Zeigler, PharmD, BCPS.   Pager: [phone]  12/07/2013,11:56 AM.

## 2013-12-07 NOTE — Evaluation (Addendum)
Clinical/Bedside Swallow Evaluation Patient Details  Name: Katrina Flynn MRN: [id number] Date of Birth: 1923-01-29  Today's Date: 12/07/2013 Time: 1505-1600 SLP Time Calculation (min): 55 min  Past Medical History:  Past Medical History  Diagnosis Date  . Hypertension   . Hyperlipidemia   . Hydrocephalus 1990's  . Complication of anesthesia   . PONV (postoperative nausea and vomiting)   . Arthritis     hands   Past Surgical History:  Past Surgical History  Procedure Laterality Date  . Csf shunt    . Fracture surgery      lt knee, rt elbow  . Esophagogastroduodenoscopy N/A 11/15/2013    Procedure: ESOPHAGOGASTRODUODENOSCOPY (EGD);  Surgeon: Louis Meckelobert D Kaplan, MD;  Location[facility];  Service: Endoscopy;  Laterality: N/A;   HPI:  Mrs. Katrina Flynn is an 80 year old white female with a history of hydrocephalus status post VP shunt, chronic gait instability who presented to the emergency department with increased weakness and fall 2. She's had a progressive decline in her functional status over the past several weeks. This started about 3-4 weeks ago when she choked while eating dinner with possible aspiration.  She underwent an EGD on 10/13 that showed an esophageal stricture with retained food extraction- started on soft diet with plans to dilate the esophagus soon.  Her son reports poor po intake since that time.  Had some blood tinged sputum which has resolved.  No cough. She has had a progressive weakness prompting them to start PT at [facility] last week.  Despite this, she continued to become increasingly weak and fell twice this morning.  Her son also felt like she seemed more lethargic with increased confusion, issues with her balance, and possible slurred speech.  No focal weakness but was concerned about the possibility of a stroke so he brought her to the emergency department. In the emergency department her head CT shows no acute abnormalities or intracranial hemorrhage.  Additional workup revealed an elevated white blood count,probable right lower lobe pneumonia on chest x-ray.     Assessment / Plan / Recommendation Clinical Impression  Pt with known esophageal stricture, pending dilitation with Dr. Arlyce DiceKaplan, now with suspected aspiration pneumonia following a choking episode at [facility] (per pt report).  Currently, pt appears to swallow normally, but has a rather loud audible swallow with all consistencies, and a delayed cough, with decreased O2 Sats and increased HR (to 147) when coughing.  A primary esophageal dysphagia is suspected, but cannot r/o a secondary pharyngeal component with possible aspiration.  An objective study of current swallow function is recommended.  Pt reports she was schedulded to have the stricture dialated on 12/19/13.  Could this be done while pt is in the hospital?  SLP called Dr. Silvano RuskPaterson's office to discuss above.  MD agrees to have GI f/u for dilitation prior to further SLP swallow evaluations. SLP will f/u after GI, to determine if MBS is still indicated.    Aspiration Risk  Moderate    Diet Recommendation Dysphagia 3 (Mechanical Soft);Thin liquid (Chopped meats)   Liquid Administration via: Cup;Straw Medication Administration: Crushed with puree Supervision: Patient able to self feed;Full supervision/cueing for compensatory strategies Compensations: Slow rate;Small sips/bites Postural Changes and/or Swallow Maneuvers: Seated upright 90 degrees;Upright 30-60 min after meal    Other  Recommendations Recommended Consults: MBS;Consider GI evaluation (? GI for dilitation while pt is in-house) Oral Care Recommendations: Oral care BID Other Recommendations: Clarify dietary restrictions   Follow Up Recommendations  24 hour supervision/assistance  Frequency and Duration        Pertinent Vitals/Pain No complaints of pain; HR increased to 147 and Sats decreased to 86% during this assessment (pt coughing).         Swallow  Study Prior Functional Status  Type of Home: Independent living facility    General Date of Onset: 11/15/13 HPI: Mrs. Katrina Flynn is an 80 year old white female with a history of hydrocephalus status post VP shunt, chronic gait instability who presented to the emergency department with increased weakness and fall 2. She's had a progressive decline in her functional status over the past several weeks. This started about 3-4 weeks ago when she choked while eating dinner with possible aspiration.  She underwent an EGD on 10/13 that showed an esophageal stricture with retained food extraction- started on soft diet with plans to dilate the esophagus soon.  Her son reports poor po intake since that time.  Had some blood tinged sputum which has resolved.  No cough. She has had a progressive weakness prompting them to [facility].  Despite this, she continued to become increasingly weak and fell twice this morning.  Her son also felt like she seemed more lethargic with increased confusion, issues with her balance, and possible slurred speech.  No focal weakness but was concerned about the possibility of a stroke so he brought her to the emergency department. In the emergency department her head CT shows no acute abnormalities or intracranial hemorrhage. Additional workup revealed an elevated white blood count,probable right lower lobe pneumonia on chest x-ray.   Type of Study: Bedside swallow evaluation Previous Swallow Assessment: None (EGD by Dr. Arlyce DiceKaplan, esophageal stricture with plan to dialate) Diet Prior to this Study: Dysphagia 3 (soft);Thin liquids Temperature Spikes Noted: No History of Recent Intubation: No Behavior/Cognition: Alert;Cooperative;Distractible;Requires cueing Oral Cavity - Dentition: Adequate natural dentition Self-Feeding Abilities: Able to feed self;Needs set up Patient Positioning: Upright in bed Baseline Vocal Quality: Clear Volitional Cough:  Strong Volitional Swallow: Able to elicit    Oral/Motor/Sensory Function Overall Oral Motor/Sensory Function: Appears within functional limits for tasks assessed   Ice Chips Ice chips: Not tested   Thin Liquid Thin Liquid: Within functional limits Presentation: Cup;Straw Oral Phase Functional Implications:  (Audible swallows)    Nectar Thick Nectar Thick Liquid: Within functional limits   Honey Thick Honey Thick Liquid: Not tested   Puree Puree: Impaired Presentation: Self Fed Pharyngeal Phase Impairments: Multiple swallows   Solid   GO    Solid: Impaired Presentation: Self Fed Oral Phase Impairments: Impaired mastication (Took tiny bites) Oral Phase Functional Implications:  (increased time for oral prep) Pharyngeal Phase Impairments: Multiple swallows       Giorgia Wahler T 12/07/2013,4:01 PM

## 2013-12-07 NOTE — Progress Notes (Signed)
INITIAL NUTRITION ASSESSMENT  DOCUMENTATION CODES Per approved criteria  -Severe malnutrition in the context of acute illness or injury  Pt meets criteria for severe MALNUTRITION in the context of acute illness as evidenced by PO intake <50% for > 5 days, 5.7% body weight loss in one month.   INTERVENTION: -Recommend Beneprotein protein supplement TID w/meals, each supplement provides 25 kcal, 6 gram protein -Recommend Resource Breeze po TID, each supplement provides 250 kcal and 9 grams of protein -Recommend MagicCup BID w/meals, each supplement provides 290 kcal, 9 gram protein -Diet textures per SLP -Modify pt to Room Service Assist -RD to continue to monitor  NUTRITION DIAGNOSIS: Inadequate oral intake related to difficulty swallowing/decreased appetite as evidenced by PO intake < 75%, wt loss.   Goal: Pt to meet >/= 90% of their estimated nutrition needs    Monitor:  Swallow profile, labs, weights, total protein/energy intake, labs, supplement tolerance  Reason for Assessment: MST/Consult to Assess  80 y.o. female  Admitting Dx: <principal problem not specified>  ASSESSMENT: Katrina Flynn is an 80 year old white female with a history of hydrocephalus status post VP shunt, chronic gait instability who presented to the emergency department with increased weakness and fall 2  -Pt's son reported decreased appetite for past 3-4 weeks. Was placed on Soft diet at independent living facility d/t esophageal stricture. Diet recall indicates pt consuming two small meals daily consisting of yogurt, soups, juices, soft fruits and occasionally toast. Was started on Ensure supplement; however refused as they cause her to experience loose stools -Son endorsed weight loss, was unable to quantify amount Previous medical records indicate an 8 lb weight loss in past one month (5.7% body weight, severe for time frame) -Pt currently on soft/low residue diet, consuming approximately 50% of meals.  Provided pt Raytheonesource Breeze, which she was willing to consume and tolerated -Discussed high protein snacks and supplements with son. Son noted pt would not likely consume snacks; however would likely eat proteins supplement if premixed with foods. Pt also enjoys ice cream,and would benefit from MagicCup supplement w/meals -SLP evaluation pending. Per discussion with RN, possible for pt to either undergo Barium swallow during admit vs EGD w/GI consult. Will monitor diet textures recommendations and modify supplements as warranted  Height: Ht Readings from Last 1 Encounters:  12/06/13 5\' 6"  (1.676 m)    Weight: Wt Readings from Last 1 Encounters:  12/07/13 132 lb 4.4 oz (60 kg)    Ideal Body Weight: 130 lb  % Ideal Body Weight: 102%  Wt Readings from Last 10 Encounters:  12/07/13 132 lb 4.4 oz (60 kg)  11/15/13 140 lb (63.504 kg)    Usual Body Weight: 140 lb per medical records  % Usual Body Weight: 94%  BMI:  Body mass index is 21.36 kg/(m^2).  Estimated Nutritional Needs: Kcal: 1600-1800 Protein: 70-80 gram Fluid: >/= 1800 ml daily  Skin: WDL  Diet Order: DIET SOFT  EDUCATION NEEDS: -Education needs addressed   Intake/Output Summary (Last 24 hours) at 12/07/13 1105 Last data filed at 12/07/13 0838  Gross per 24 hour  Intake 2135.83 ml  Output      0 ml  Net 2135.83 ml    Last BM: WDL PTA   Labs:   Recent Labs Lab 12/06/13 1110 12/07/13 0318  NA 136* 140  K 4.6 4.0  CL 97 104  CO2 24 24  BUN 26* 20  CREATININE 1.53* 1.05  CALCIUM 9.3 8.4  GLUCOSE 198* 139*    CBG (last  3)  No results for input(s): GLUCAP in the last 72 hours.  Scheduled Meds: . antiseptic oral rinse  7 mL Mouth Rinse q12n4p  . atorvastatin  10 mg Oral Q breakfast  . ceFEPime (MAXIPIME) IV  1 g Intravenous Q24H  . chlorhexidine  15 mL Mouth Rinse BID  . enoxaparin (LOVENOX) injection  30 mg Subcutaneous Q24H  . feeding supplement (RESOURCE BREEZE)  1 Container Oral TID BM   . Influenza vac split quadrivalent PF  0.5 mL Intramuscular Tomorrow-1000  . multivitamin with minerals  1 tablet Oral BH-q7a  . pantoprazole  40 mg Oral BID  . protein supplement  1 scoop Oral TID WC  . sodium chloride  3 mL Intravenous Q12H    Continuous Infusions: . sodium chloride 75 mL/hr at 12/07/13 [id number]    Past Medical History  Diagnosis Date  . Hypertension   . Hyperlipidemia   . Hydrocephalus 1990's  . Complication of anesthesia   . PONV (postoperative nausea and vomiting)   . Arthritis     hands    Past Surgical History  Procedure Laterality Date  . Csf shunt    . Fracture surgery      lt knee, rt elbow  . Esophagogastroduodenoscopy N/A 11/15/2013    Procedure: ESOPHAGOGASTRODUODENOSCOPY (EGD);  Surgeon: Louis Meckelobert D Kaplan, MD;  [facility];  Service: Endoscopy;  Laterality: N/A;    Lloyd HugerSarah F Yanissa Michalsky MS RD LDN Clinical Dietitian Pager:[phone]

## 2013-12-07 NOTE — Progress Notes (Signed)
CSW reviewed chart and noted that PT recommending New SNF.  CSW attempted to meet with pt to discuss, but pt transferring to unit at this time.  CSW to follow up at a later time to discuss SNF placement and complete full psychosocial assessment.  Loletta SpecterSuzanna Kidd, MSW, LCSW Clinical Social Work [phone]

## 2013-12-08 ENCOUNTER — Inpatient Hospital Stay (HOSPITAL_COMMUNITY): Payer: Medicare Other

## 2013-12-08 DIAGNOSIS — E43 Unspecified severe protein-calorie malnutrition: Secondary | ICD-10-CM

## 2013-12-08 LAB — BASIC METABOLIC PANEL
Anion gap: 13 (ref 5–15)
BUN: 18 mg/dL (ref 6–23)
CALCIUM: 7.9 mg/dL — AB (ref 8.4–10.5)
CO2: 21 meq/L (ref 19–32)
Chloride: 102 mEq/L (ref 96–112)
Creatinine, Ser: 0.89 mg/dL (ref 0.50–1.10)
GFR calc Af Amer: 65 mL/min — ABNORMAL LOW (ref >90)
GFR calc non Af Amer: 56 mL/min — ABNORMAL LOW (ref >90)
Glucose, Bld: 148 mg/dL — ABNORMAL HIGH (ref 70–99)
Potassium: 3.5 mEq/L — ABNORMAL LOW (ref 3.7–5.3)
SODIUM: 136 meq/L — AB (ref 137–147)

## 2013-12-08 LAB — CBC
HEMATOCRIT: 33.8 % — AB (ref 36.0–46.0)
Hemoglobin: 11.5 g/dL — ABNORMAL LOW (ref 12.0–15.0)
MCH: 31.3 pg (ref 26.0–34.0)
MCHC: 34 g/dL (ref 30.0–36.0)
MCV: 91.8 fL (ref 78.0–100.0)
PLATELETS: 320 10*3/uL (ref 150–400)
RBC: 3.68 MIL/uL — AB (ref 3.87–5.11)
RDW: 12.5 % (ref 11.5–15.5)
WBC: 16.4 10*3/uL — AB (ref 4.0–10.5)

## 2013-12-08 MED ORDER — POTASSIUM CHLORIDE IN NACL 20-0.9 MEQ/L-% IV SOLN
INTRAVENOUS | Status: DC
  Administered 2013-12-08 – 2013-12-10 (×2): via INTRAVENOUS
  Filled 2013-12-08 (×4): qty 1000

## 2013-12-08 MED ORDER — VANCOMYCIN HCL IN DEXTROSE 1-5 GM/200ML-% IV SOLN
1000.0000 mg | INTRAVENOUS | Status: DC
  Administered 2013-12-08 – 2013-12-09 (×2): 1000 mg via INTRAVENOUS
  Filled 2013-12-08 (×3): qty 200

## 2013-12-08 NOTE — Progress Notes (Signed)
Subjective: Continues to have mildly productive cough, no dyspnea on oxygen  Objective: Vital signs in last 24 hours: Temp:  [97.7 F (36.5 C)-98.9 F (37.2 C)] 97.9 F (36.6 C) (11/05 0634) Pulse Rate:  [63-110] 109 (11/05 0634) Resp:  [18-26] 22 (11/05 0634) BP: (111-157)/(47-80) 111/61 mmHg (11/05 0634) SpO2:  [89 %-98 %] 90 % (11/05 0634) Weight change:    Intake/Output from previous day: 11/04 0701 - 11/05 0700 In: 2273.2 [P.O.:240; I.V.:1933.2; IV Piggyback:100] Out: -    General appearance: alert, cooperative, no distress and occasional dry cough Resp: bilateral basilar crackles Cardio: irregularly irregular rhythm GI: soft, non-tender; bowel sounds normal; no masses,  no organomegaly Extremities: extremities normal, atraumatic, no cyanosis or edema alert and answered questions appropriately  Lab Results:  Recent Labs  12/07/13 0318 12/08/13 0355  WBC 18.2* 16.4*  HGB 11.0* 11.5*  HCT 33.2* 33.8*  PLT 296 320   BMET  Recent Labs  12/07/13 0318 12/08/13 0355  NA 140 136*  K 4.0 3.5*  CL 104 102  CO2 24 21  GLUCOSE 139* 148*  BUN 20 18  CREATININE 1.05 0.89  CALCIUM 8.4 7.9*   CMET CMP     Component Value Date/Time   NA 136* 12/08/2013 0355   K 3.5* 12/08/2013 0355   CL 102 12/08/2013 0355   CO2 21 12/08/2013 0355   GLUCOSE 148* 12/08/2013 0355   BUN 18 12/08/2013 0355   CREATININE 0.89 12/08/2013 0355   CALCIUM 7.9* 12/08/2013 0355   GFRNONAA 56* 12/08/2013 0355   GFRAA 65* 12/08/2013 0355    CBG (last 3)  No results for input(s): GLUCAP in the last 72 hours.  INR RESULTS:  No results found for: INR, PROTIME   Studies/Results: Dg Chest 2 View  12/06/2013   CLINICAL DATA:  Altered mental status. Recent fall with slurred speech.  EXAM: CHEST  2 VIEW  COMPARISON:  One-view chest 11/14/2013  FINDINGS: The heart size is normal. Lung volumes are low. A superior segment right lower lobe pneumonia is evident. There is mild diffuse edema.  New small right pleural effusion is evident. Atherosclerotic calcifications noted in the aortic arch. Shunt tubing is evident on the patient's known ventriculostomy.  IMPRESSION: 1. New superior segment right lower lobe pneumonia. 2. Mild interstitial edema.   Electronically Signed   By: Gennette Pachris  Mattern M.D.   On: 12/06/2013 13:02   Ct Head Wo Contrast  12/06/2013   CLINICAL DATA:  80 year old female with 2 falls this morning. Confusion. Hypertension. Hydrocephalus. Initial encounter.  EXAM: CT HEAD WITHOUT CONTRAST  TECHNIQUE: Contiguous axial images were obtained from the base of the skull through the vertex without intravenous contrast.  COMPARISON:  04/14/2013.  FINDINGS: Post left occipital craniectomy. Postoperative cerebral spinal fluid collection at the craniectomy site appears similar to the prior exam. Shunt catheter traverses partially through this collection.  Left parietal shunt catheter extends into left lateral ventricle impressing upon the septum unchanged in position. Right frontal shunt catheter with the tip at the level of the frontal horn unchanged in position. No development of hydrocephalus or slit like ventricles.  No skull fracture is detected.  Extra-axial collection has an appearance more suggestive of a dural thickening rather than result of recent fall and is without change from the prior examination. Overall, no acute intracranial hemorrhage detected.  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  No CT evidence of large acute infarct.  No intracranial mass lesion noted on  this unenhanced exam.  IMPRESSION: No acute intracranial abnormality noted.  Post left occipital craniectomy. Postoperative cerebral spinal fluid collection at the craniectomy site appears similar to the prior exam. Shunt catheter traverses partially through this collection.  Left parietal shunt catheter extends into left lateral ventricle impressing upon the septum  unchanged in position. Right frontal shunt catheter with the tip at the level of the frontal horn unchanged in position. No development of hydrocephalus or slit like ventricles.  No skull fracture is detected.  Extra-axial collection has an appearance more suggestive of a dural thickening rather than result of recent fall and is without change from the prior examination. Overall, no acute intracranial hemorrhage detected.  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  No CT evidence of large acute infarct.   Electronically Signed   By: Bridgett LarssonSteve  Olson M.D.   On: 12/06/2013 11:56    Medications: I have reviewed the patient's current medications.  Assessment/Plan: #1 Pneumonia: stable and likely due to aspiration. Will add vancomycin to regimen for broader coverage. #2 Atrial fibrillation: tachycardia improving and will increase cardizem rate and check echo #3 Esophageal stricture: will request GI evaluation for consideration of stricture dilatation. #4 Protein calorie malnutrition: severe and will continue nutrition supplements   LOS: 2 days   Lyden Redner G 12/08/2013, 7:51 AM

## 2013-12-08 NOTE — Progress Notes (Signed)
Clinical Social Work Department BRIEF PSYCHOSOCIAL ASSESSMENT 12/08/2013  Patient:  Katrina Flynn,Katrina Flynn     Account Number:  [id number]     Admit date:  12/06/2013  Clinical Social Worker:  Orpah GreekFOLEY,Samyra Limb, LCSWA  Date/Time:  12/08/2013 12:24 PM  Referred by:  Physician  Date Referred:  12/08/2013 Referred for  SNF Placement   Other Referral:   Interview type:  Patient Other interview type:    PSYCHOSOCIAL DATA Living Status:  FACILITY Admitted from facility:  [facility] Level of care:  Independent Living Primary support name:  Earl GalaGreg Geving (son) ph#: [phone] Primary support relationship to patient:  CHILD, ADULT Degree of support available:   good    CURRENT CONCERNS Current Concerns  Post-Acute Placement   Other Concerns:    SOCIAL WORK ASSESSMENT / PLAN CSW received consult that patient was admitted from Indepedent Living @ [facility].   Assessment/plan status:  Information/[facility]erral to [facility] Other assessment/ plan:   Information/referral to community resources:   CSW completed FL[facility] and [facility] - provided bed offers to patient.    PATIENT'S/FAMILY'S RESPONSE TO PLAN OF CARE: Patient informed CSW that her [facility], [name] [facility] and she would prefer to go there. [facility] with [facility] that they would be able to take patient when stable. CS[facility].       Lincoln MaxinKelly Ivann Trimarco, LCSW [facility] Clinical Social Worker cell #: [phone]

## 2013-12-08 NOTE — Progress Notes (Signed)
Clinical Social Work Department CLINICAL SOCIAL WORK PLACEMENT NOTE 12/08/2013  Patient:  Katrina Flynn,Katrina Flynn  Account Number:  [id number] Admit date:  12/06/2013  Clinical Social Worker:  Orpah GreekKELLY FOLEY, LCSWA  Date/time:  12/08/2013 12:31 PM  Clinical Social Work is seeking post-discharge placement for this patient at the following level of care:   SKILLED NURSING   (*CSW will update this form in Epic as items are completed)   12/08/2013  Patient/family provided with [facility] Department of Clinical Social Work's list of facilities offering this level of care within the geographic area requested by the patient (or if unable, by the patient's family).  12/08/2013  Patient/family informed of their freedom to choose among providers that offer the needed level of care, that participate in Medicare, Medicaid or managed care program needed by the patient, have an available bed and are willing to accept the patient.  12/08/2013  Patient/family informed of [facility]' ownership interest in [facility], as well as of the fact that they are under no obligation to receive care at this facility.  PASARR submitted to EDS on 12/08/2013 PASARR number received on 12/08/2013  FL2 transmitted to all facilities in geographic area requested by pt/family on  12/08/2013 FL2 transmitted to all facilities within larger geographic area on   Patient informed that his/her managed care company has contracts with or will negotiate with  certain facilities, including the following:   [facility]     Patient/family informed of bed offers received:  12/08/2013 [facility] Physician recommends and patient chooses bed at    [facility]   Patient to be transferred to facility by  Patient and family notified of transfer on  Name of family member notified:    The following physician request were entered in Epic:   Additional Comments:   Lincoln MaxinKelly  Simone Rodenbeck, LCSW [facility] Clinical Social Worker cell #: [phone]

## 2013-12-08 NOTE — Plan of Care (Signed)
Problem: Phase I Progression Outcomes Goal: Initial discharge plan identified Outcome: Completed/Met Date Met:  12/08/13

## 2013-12-08 NOTE — Evaluation (Signed)
Occupational Therapy Evaluation Patient Details Name: Katrina Flynn MRN: [id number] DOB: [birth year] Today's Date: 12/08/2013    History of Present Illness 80 yo female admitted with pna, falls, hypotension. Hx of hydrocephalus, VP shunt, chronic gait instability, osteoporosis. Pt is from [facility],.    Clinical Impression   This 80 yo female admitted with above presents to acute OT with decreased balance, decreased mobility, fear of falling--all affecting her ability to take care of herself at a Mod I level as she was pta living in an Independent living facility. She will benefit from acute OT with follow up at SNF.    Follow Up Recommendations  SNF    Equipment Recommendations   (TBD at next venue)       Precautions / Restrictions Precautions Precautions: Fall Restrictions Weight Bearing Restrictions: No      Mobility Bed Mobility Overal bed mobility: Needs Assistance Bed Mobility: Supine to Sit     Supine to sit: Min assist;HOB elevated     General bed mobility comments: min guard A to scoot to EOB  Transfers Overall transfer level: Needs assistance Equipment used: 1 person hand held assist Transfers: Sit to/from Stand Sit to Stand: Mod assist (with cues to come more foreward to go from sit>stand)              Balance Overall balance assessment: Needs assistance Sitting-balance support: Bilateral upper extremity supported;Feet supported Sitting balance-Leahy Scale: Fair   Postural control: Posterior lean Standing balance support: Bilateral upper extremity supported Standing balance-Leahy Scale: Poor                              ADL Overall ADL's : Needs assistance/impaired Eating/Feeding: Independent;Sitting   Grooming: Set up;Supervision/safety;Sitting   Upper Body Bathing: Set up;Supervision/ safety;Sitting   Lower Body Bathing: Maximal assistance (with Mod A sit<>stand)   Upper Body Dressing : Minimal assistance;Sitting   Lower Body  Dressing: Total assistance (with Mod A sit<>stand)   Toilet Transfer: Moderate assistance;Stand-pivot (bed>recliner next to bed going to pt's left)   Toileting- Clothing Manipulation and Hygiene: Total assistance (with Mod A sit<>stand)                         Pertinent Vitals/Pain Pain Assessment: No/denies pain     Hand Dominance  right   Extremity/Trunk Assessment Upper Extremity Assessment Upper Extremity Assessment: Overall WFL for tasks assessed              Cognition Arousal/Alertness: Awake/alert Behavior During Therapy: WFL for tasks assessed/performed Overall Cognitive Status: Within Functional Limits for tasks assessed                                Home Living Family/patient expects to be discharged to:: Skilled nursing facility Living Arrangements: Alone   Type of Home: Independent living facility                                       OT Diagnosis: Generalized weakness   OT Problem List: Decreased strength;Impaired balance (sitting and/or standing);Decreased knowledge of use of DME or AE (fear of falling)   OT Treatment/Interventions: Self-care/ADL training;DME and/or AE instruction;Patient/family education;Balance training;Therapeutic activities    OT Goals(Current goals can be found in the care plan section) Acute Rehab  OT Goals Patient Stated Goal: none stated OT Goal Formulation: With patient Time For Goal Achievement: 12/22/13 Potential to Achieve Goals: Good  OT Frequency: Min 1X/week   Barriers to D/C: Decreased caregiver support             End of Session Equipment Utilized During Treatment: Gait belt Nurse Communication: Mobility status (NT)  Activity Tolerance: Patient tolerated treatment well Patient left: in chair;with call bell/phone within reach;with chair alarm set   Time: [id number] OT Time Calculation (min): 21 min Charges:  OT General Charges $OT Visit: 1 Procedure OT  Evaluation $Initial OT Evaluation Tier I: 1 Procedure OT Treatments $Self Care/Home Management : 8-22 mins  Evette GeorgesLeonard, Junell Cullifer Eva [phone] 12/08/2013, 10:49 AM

## 2013-12-08 NOTE — Progress Notes (Signed)
ANTIBIOTIC CONSULT NOTE   Pharmacy Consult for Vancomycin Indication: pneumonia  No Known Allergies  Patient Measurements: Height: 5\' 6"  (167.6 cm) Weight: 132 lb 4.4 oz (60 kg) IBW/kg (Calculated) : 59.3  Vital Signs: Temp: 97.9 F (36.6 C) (11/05 0634) Temp Source: Oral (11/05 0634) BP: 111/61 mmHg (11/05 0634) Pulse Rate: 109 (11/05 0634) Intake/Output from previous day: 11/04 0701 - 11/05 0700 In: 2273.2 [P.O.:240; I.V.:1933.2; IV Piggyback:100] Out: -  Intake/Output from this shift:    Labs:  Recent Labs  12/06/13 1110 12/07/13 0318 12/08/13 0355  WBC 19.8* 18.2* 16.4*  HGB 11.4* 11.0* 11.5*  PLT 310 296 320  CREATININE 1.53* 1.05 0.89   Estimated Creatinine Clearance: 40.1 mL/min (by C-G formula based on Cr of 0.89). No results for input(s): VANCOTROUGH, VANCOPEAK, VANCORANDOM, GENTTROUGH, GENTPEAK, GENTRANDOM, TOBRATROUGH, TOBRAPEAK, TOBRARND, AMIKACINPEAK, AMIKACINTROU, AMIKACIN in the last 72 hours.    Assessment: 80yo F w/ weakness, recurrent falls, and AMS. Hx dysphagia, underwent EGD for food disimpaction on 11/15/13. Pharmacy dosing cefepime for suspected aspiration pna/HCAP, now consulted to add vancomycin.  11/3 >> Clinda x 1 11/3 >> Cefepime >>  11/5 >> Vancomycin >>  Tmax: AF WBCs: elevated but improving Renal: SCr now WNL, CrCl~40 ml/min (CG)  11/3 blood: NGTD 11/3 urine: NGF 11/3 MRSA screen: negative  Goal of Therapy:  Vancomycin trough level 15-20 mcg/ml Appropriate antibiotic dosing for renal function; eradication of infection  Plan:  1.  Vancomycin 1g IV q24h. 2.  Continue Cefepime 1g IV q12h. 3.  F/u SCr, trough level, culture results, clinical course.  Clance BollAmanda Dante Cooter, PharmD, BCPS Pager: [phone] 12/08/2013 8:23 AM

## 2013-12-08 NOTE — Progress Notes (Signed)
Patient found to be in Afib with RVR via EKG performed at beginning of shift due to increased hear rate.  Rate was ranging from the 140's to the 160's and EKG showed rate of 152.  Patient denied any chest pain or SOB.  Notified MD on call, Dr. Evlyn KannerSouth, who gave order for Cardizem drip at 5.  Rate this am ranging from low one teens to 140's.  Will continue to monitor patient.

## 2013-12-08 NOTE — Plan of Care (Signed)
Problem: Phase I Progression Outcomes Goal: Confirm chest x-ray completed Outcome: Completed/Met Date Met:  12/08/13 Goal: Code status addressed with pt/family Outcome: Completed/Met Date Met:  12/08/13 Goal: Voiding-avoid urinary catheter unless indicated Outcome: Completed/Met Date Met:  12/08/13

## 2013-12-09 ENCOUNTER — Inpatient Hospital Stay (HOSPITAL_COMMUNITY): Payer: Medicare Other

## 2013-12-09 DIAGNOSIS — J690 Pneumonitis due to inhalation of food and vomit: Principal | ICD-10-CM

## 2013-12-09 DIAGNOSIS — I359 Nonrheumatic aortic valve disorder, unspecified: Secondary | ICD-10-CM

## 2013-12-09 DIAGNOSIS — I959 Hypotension, unspecified: Secondary | ICD-10-CM

## 2013-12-09 DIAGNOSIS — K209 Esophagitis, unspecified: Secondary | ICD-10-CM

## 2013-12-09 DIAGNOSIS — K222 Esophageal obstruction: Secondary | ICD-10-CM

## 2013-12-09 DIAGNOSIS — J189 Pneumonia, unspecified organism: Secondary | ICD-10-CM

## 2013-12-09 LAB — BASIC METABOLIC PANEL
Anion gap: 16 — ABNORMAL HIGH (ref 5–15)
BUN: 15 mg/dL (ref 6–23)
CALCIUM: 8.2 mg/dL — AB (ref 8.4–10.5)
CO2: 20 meq/L (ref 19–32)
Chloride: 103 mEq/L (ref 96–112)
Creatinine, Ser: 0.79 mg/dL (ref 0.50–1.10)
GFR calc Af Amer: 83 mL/min — ABNORMAL LOW (ref >90)
GFR calc non Af Amer: 72 mL/min — ABNORMAL LOW (ref >90)
GLUCOSE: 247 mg/dL — AB (ref 70–99)
Potassium: 3.7 mEq/L (ref 3.7–5.3)
SODIUM: 139 meq/L (ref 137–147)

## 2013-12-09 LAB — CBC
HEMATOCRIT: 35.1 % — AB (ref 36.0–46.0)
Hemoglobin: 11.8 g/dL — ABNORMAL LOW (ref 12.0–15.0)
MCH: 31.2 pg (ref 26.0–34.0)
MCHC: 33.6 g/dL (ref 30.0–36.0)
MCV: 92.9 fL (ref 78.0–100.0)
Platelets: 389 10*3/uL (ref 150–400)
RBC: 3.78 MIL/uL — ABNORMAL LOW (ref 3.87–5.11)
RDW: 12.9 % (ref 11.5–15.5)
WBC: 18.8 10*3/uL — AB (ref 4.0–10.5)

## 2013-12-09 MED ORDER — AMIODARONE HCL IN DEXTROSE 360-4.14 MG/200ML-% IV SOLN
30.0000 mg/h | INTRAVENOUS | Status: DC
Start: 2013-12-09 — End: 2013-12-10

## 2013-12-09 MED ORDER — SPIRONOLACTONE 50 MG PO TABS
50.0000 mg | ORAL_TABLET | Freq: Every day | ORAL | Status: DC
  Administered 2013-12-09 – 2013-12-14 (×6): 50 mg via ORAL
  Filled 2013-12-09: qty 2
  Filled 2013-12-09: qty 1
  Filled 2013-12-09: qty 2
  Filled 2013-12-09 (×2): qty 1
  Filled 2013-12-09: qty 2
  Filled 2013-12-09: qty 1

## 2013-12-09 MED ORDER — FUROSEMIDE 10 MG/ML IJ SOLN
40.0000 mg | Freq: Two times a day (BID) | INTRAMUSCULAR | Status: DC
  Administered 2013-12-09 – 2013-12-14 (×11): 40 mg via INTRAVENOUS
  Filled 2013-12-09 (×15): qty 4

## 2013-12-09 MED ORDER — AMIODARONE HCL IN DEXTROSE 360-4.14 MG/200ML-% IV SOLN: 60.0000 mg/h | INTRAVENOUS | Status: AC

## 2013-12-09 MED ORDER — SUCRALFATE 1 GM/10ML PO SUSP
1.0000 g | Freq: Four times a day (QID) | ORAL | Status: DC
  Administered 2013-12-10: 1 g via ORAL
  Filled 2013-12-09 (×4): qty 10

## 2013-12-09 MED ORDER — AMIODARONE LOAD VIA INFUSION
150.0000 mg | Freq: Once | INTRAVENOUS | Status: DC
  Filled 2013-12-09: qty 83.34

## 2013-12-09 NOTE — Progress Notes (Addendum)
Speech Language Pathology Treatment: Dysphagia  Patient Details Name: Katrina Flynn MRN: [id number] DOB: [birth year] Today's Date: 12/09/2013 Time: [id number] SLP Time Calculation (min): 20 min  Assessment / Plan / Recommendation Clinical Impression  SLP was asked by RN to come into room as pt was sitting upright consuming Boost Breeze.  Pt with excessive hoarseness, audible swallow and overt coughing during all intake observed concerning for aspiration.  Pt was found in respiratory distress with increased respirations and rhonchi/ wheezes. ? If respiratory event was possibly due to aspiration.   SLP noted there was not a diet order in the computer - ? If plan is for EGD.  Also question component of pharyngeal dysphagia at this time and would recommend strict npo until either EGD or instrumental swallow evaluation can be completed to assess for possible aspiration.    CXR 12/09/13:   Cardiac enlargement with pulmonary vascular congestion. Increasing right perihilar infiltration or edema. Small right pleural effusion.    SLP educated pt to concerns for aspiration and clinical reasoning for recommending NPO except ice - however defer to MD as pt likely with primary esophageal dysphagia.  Pt educated using teach back. Will follow up next week.     HPI HPI: Katrina Flynn is an 80 year old white female with a history of hydrocephalus status post VP shunt, chronic gait instability who presented to the emergency department with increased weakness and fall 2. She's had a progressive decline in her functional status over the past several weeks. This started about 3-4 weeks ago when she choked while eating dinner with possible aspiration.  She underwent an EGD on 10/13 that showed an esophageal stricture with retained food extraction- started on soft diet with plans to dilate the esophagus soon.  Her son reports poor po intake since that time.  Had some blood tinged sputum which has resolved.  No cough. She has  had a progressive weakness prompting [facility].  Despite this, she continued to become increasingly weak and fell twice this morning.  Her son also felt like she seemed more lethargic with increased confusion, issues with her balance, and possible slurred speech.  No focal weakness but was concerned about the possibility of a stroke so he brought her to the emergency department. In the emergency department her head CT shows no acute abnormalities or intracranial hemorrhage. Additional workup revealed an elevated white blood count,probable right lower lobe pneumonia on chest x-ray.     Pertinent Vitals Pain Assessment: No/denies pain  SLP Plan  Continue with current plan of care    Recommendations Diet recommendations: NPO (ice chips prn after oral care) Medication Administration: Via alternative means              Oral Care Recommendations: Oral care BID Follow up Recommendations: 24 hour supervision/assistance Plan: Continue with current plan of care    GO    Donavan Burnetamara Franchelle Foskett, MS [facility] SLP [phone]

## 2013-12-09 NOTE — Care Management Note (Signed)
    Page 1 of 2   12/09/2013     1:03:27 PM CARE MANAGEMENT NOTE 12/09/2013  Patient:  Katrina Flynn,Katrina Flynn   Account Number:  [id number]  Date Initiated:  12/09/2013  Documentation initiated by:  Julita Ozbun  Subjective/Objective Assessment:   pt transferred to sdu on [id number] due to dyspnea and o2 levels dropping down to 63 requiring 100% o2 with nrb/a.fib on monitor iv cardizem started.     Action/Plan:   [facility]/ may need snf placement   Anticipated DC Date:  12/12/2013   Anticipated DC Plan:  SKILLED NURSING FACILITY  In-house referral  Clinical Social Worker      DC Planning Services  CM consult      [facility]  NA   Choice offered to / List presented to:  NA   DME arranged  NA      DME agency  NA     HH arranged  NA      HH agency  NA   Status of service:  In process, will continue to follow Medicare Important Message given?   (If response is "NO", the following Medicare IM given date fields will be blank) Date Medicare IM given:   Medicare IM given by:   Date Additional Medicare IM given:   Additional Medicare IM given by:    Discharge Disposition:    Per UR Regulation:  Reviewed for med. necessity/level of care/duration of stay  If discussed at Long Length of Stay Meetings, dates discussed:    Comments:  [id number]/Geneve Kimpel Earlene Plateravis, RN, BSN, CCM Chart reviewed.  transferred to sdu 1223 on [id number] Discharge needs and patient's stay to be reviewed and followed by case manager.

## 2013-12-09 NOTE — Consult Note (Signed)
Referring Provider: Dr. Molli KnockYacoub Primary Care Physician:  Dr. Jarome Matinaniel Paterson Primary Gastroenterologist:  Dr. Arlyce DiceKaplan  Reason for Consultation:  Aspiration PNA; history of esophageal stricture/food impaction  HPI: Katrina Flynn is a 80 y.o. female with a history of hydrocephalus status post VP shunt and chronic gait instability who presented to the emergency department with increased weakness and fall  2. She's had a progressive decline in her functional status over the past several weeks. This started about 3-4 weeks ago when she choked while eating dinner with possible aspiration. She underwent an EGD on 10/13 that showed an esophageal stricture with retained food extraction- started on soft diet with plans to dilate the esophagus soon. Her son reports poor po intake since that time.  Patient says that she was doing mostly liquids at home.  Had some blood tinged sputum which has resolved. No cough. She has had a progressive weakness prompting them to start PT at [facility] last week. Despite this, she continued to become increasingly weak and fell twice on the morning of this admission. Her son also felt like she seemed more lethargic with increased confusion, issues with her balance, and possible slurred speech. No focal weakness, but was concerned about the possibility of a stroke so he brought her to the emergency department. In the emergency department her head CT shows no acute abnormalities or intracranial hemorrhage. Additional workup revealed an elevated white blood count, probable right lower lobe pneumonia on chest x-ray. In the emergency department her blood pressure dropped to 89/43. Due to hypotension she was admitted to a stepdown unit for treatment. She was given a dose of Clindamycin and cefepime in the emergency department.  She continues on antibiotics.  This morning she had a coughing fit with her O2 saturations dropping so PCCM was consulted.  Her BP was tenuous as  well.  EGD 11/15/2013 by Dr. Arlyce DiceKaplan as follows:  ENDOSCOPIC IMPRESSION: 1. There was a short stricture at the gastroesophageal junction 2. There was a moderate amount of residual food seen in the entire esophagus 3. There was LA Class D esophagitis noted 4. EGD was otherwise normal  RECOMMENDATIONS: 1. begin Protonix 40 mg twice a day 2. soft diet 3. endoscopy with balloon dilation 2-3 weeks  She has been taking the protonix twice daily since her EGD.  When I saw her in consult she was comfortable on nasal cannula and was not having any significant coughing.  BP was in the 120's systolic.  She is currently NPO "until seen by GI".  Her son says that she tried some liquids this morning and the nurses said that "it did not go well".  Patient denies any pain or other issues currently.   Past Medical History  Diagnosis Date  . Hypertension   . Hyperlipidemia   . Hydrocephalus 1990's  . Complication of anesthesia   . PONV (postoperative nausea and vomiting)   . Arthritis     hands    Past Surgical History  Procedure Laterality Date  . Csf shunt    . Fracture surgery      lt knee, rt elbow  . Esophagogastroduodenoscopy N/A 11/15/2013    Procedure: ESOPHAGOGASTRODUODENOSCOPY (EGD);  Surgeon: Louis Meckelobert D Kaplan, MD;  [facility];  Service: Endoscopy;  Laterality: N/A;    Prior to Admission medications   Medication Sig Start Date End Date Taking? Authorizing Provider  ALPRAZolam Prudy Feeler(XANAX) 0.5 MG tablet Take 0.5 mg by mouth at bedtime as needed for anxiety.   Yes  Historical Provider, MD  amLODipine (NORVASC) 2.5 MG tablet Take 2.5 mg by mouth every morning.  04/13/13  Yes Historical Provider, MD  atorvastatin (LIPITOR) 10 MG tablet Take 10 mg by mouth every morning.  04/13/13  Yes Historical Provider, MD  CALCIUM PO Take 1 tablet by mouth 2 (two) times daily.   Yes Historical Provider, MD  Fish Oil OIL Take 1 capsule by mouth every morning.    Yes Historical Provider, MD   losartan (COZAAR) 100 MG tablet Take 100 mg by mouth every morning.  04/13/13  Yes Historical Provider, MD  Multiple Vitamin (MULTIVITAMIN WITH MINERALS) TABS tablet Take 1 tablet by mouth every morning.    Yes Historical Provider, MD  pantoprazole (PROTONIX) 40 MG tablet Take 40 mg by mouth 2 (two) times daily.   Yes Historical Provider, MD  Multiple Vitamins-Minerals (ICAPS MV PO) Take 1 tablet by mouth every morning.     Historical Provider, MD    Current Facility-Administered Medications  Medication Dose Route Frequency Provider Last Rate Last Dose  . 0.9 % NaCl with KCl 20 mEq/ L  infusion   Intravenous Continuous Jarome Matinaniel Paterson, MD 75 mL/hr at 12/09/13 [phone]    . acetaminophen (TYLENOL) tablet 650 mg  650 mg Oral Q6H PRN Martha ClanWilliam Shaw, MD       Or  . acetaminophen (TYLENOL) suppository 650 mg  650 mg Rectal Q6H PRN Martha ClanWilliam Shaw, MD      . albuterol (PROVENTIL) (2.5 MG/3ML) 0.083% nebulizer solution 2.5 mg  2.5 mg Nebulization Q2H PRN Martha ClanWilliam Shaw, MD   2.5 mg at 12/09/13 0550  . ALPRAZolam Prudy Feeler(XANAX) tablet 0.5 mg  0.5 mg Oral QHS PRN Martha ClanWilliam Shaw, MD      . alum & mag hydroxide-simeth (MAALOX/MYLANTA) 200-200-20 MG/5ML suspension 30 mL  30 mL Oral Q6H PRN Martha ClanWilliam Shaw, MD      . amiodarone (NEXTERONE) 1.8 mg/mL load via infusion 150 mg  150 mg Intravenous Once Jarome Matinaniel Paterson, MD       Followed by  . amiodarone (NEXTERONE PREMIX) 360 MG/200ML (1.8 mg/mL) IV infusion  30 mg/hr Intravenous Continuous Jarome Matinaniel Paterson, MD      . antiseptic oral rinse (CPC / CETYLPYRIDINIUM CHLORIDE 0.05%) solution 7 mL  7 mL Mouth Rinse q12n4p Jarome Matinaniel Paterson, MD   7 mL at 12/08/13 1612  . atorvastatin (LIPITOR) tablet 10 mg  10 mg Oral Q breakfast Martha ClanWilliam Shaw, MD   10 mg at 12/09/13 0914  . ceFEPIme (MAXIPIME) 1 g in dextrose 5 % 50 mL IVPB  1 g Intravenous Q12H Dannielle Huhustin George Zeigler, RPH   1 g at 12/08/13 2152  . chlorhexidine (PERIDEX) 0.12 % solution 15 mL  15 mL Mouth Rinse BID Jarome Matinaniel Paterson, MD   15 mL  at 12/09/13 0913  . diltiazem (CARDIZEM) 100 mg in dextrose 5 % 100 mL (1 mg/mL) infusion  10 mg/hr Intravenous Titrated Jarome Matinaniel Paterson, MD 5 mL/hr at 12/09/13 1200 5 mg/hr at 12/09/13 1200  . enoxaparin (LOVENOX) injection 40 mg  40 mg Subcutaneous Q24H Tad MooreDustin George White HavenZeigler, RPH   40 mg at 12/08/13 2152  . feeding supplement (RESOURCE BREEZE) (RESOURCE BREEZE) liquid 1 Container  1 Container Oral TID BM Martha ClanWilliam Shaw, MD   1 Container at 12/09/13 [phone]  . furosemide (LASIX) injection 40 mg  40 mg Intravenous Q12H Jarome Matinaniel Paterson, MD   40 mg at 12/09/13 0914  . guaiFENesin-dextromethorphan (ROBITUSSIN DM) 100-10 MG/5ML syrup 5 mL  5 mL Oral Q4H PRN Martha ClanWilliam Shaw,  MD      . ipratropium (ATROVENT) nebulizer solution 0.5 mg  0.5 mg Nebulization Q6H PRN Martha Clan, MD      . multivitamin with minerals tablet 1 tablet  1 tablet Oral Lesle Reek Martha Clan, MD   1 tablet at 12/09/13 0914  . pantoprazole (PROTONIX) EC tablet 40 mg  40 mg Oral BID Martha Clan, MD   40 mg at 12/09/13 0914  . polyethylene glycol (MIRALAX / GLYCOLAX) packet 17 g  17 g Oral Daily PRN Martha Clan, MD      . protein supplement (RESOURCE BENEPROTEIN) powder packet 6 g  6 g Oral TID WC Sharyne Richters, RD   6 g at 12/08/13 1856  . sodium chloride 0.9 % injection 3 mL  3 mL Intravenous Q12H Martha Clan, MD   3 mL at 12/06/13 1724  . spironolactone (ALDACTONE) tablet 50 mg  50 mg Oral Daily Jarome Matin, MD   50 mg at 12/09/13 0914  . vancomycin (VANCOCIN) IVPB 1000 mg/200 mL premix  1,000 mg Intravenous Q24H Maryanna Shape Runyon, RPH   1,000 mg at 12/09/13 0900    Allergies as of 12/06/2013  . (No Known Allergies)    History reviewed. No pertinent family history.  History   Social History  . Marital Status: Married    Spouse Name: N/A    Number of Children: N/A  . Years of Education: N/A   Occupational History  . Not on file.   Social History Main Topics  . Smoking status: Never Smoker   . Smokeless tobacco:  Never Used  . Alcohol Use: No  . Drug Use: Not on file  . Sexual Activity: No   Other Topics Concern  . Not on file   Social History Narrative    Review of Systems: Ten point ROS is O/W negative except as mentioned in HPI.  Physical Exam: Vital signs in last 24 hours: Temp:  [97.3 F (36.3 C)-98.4 F (36.9 C)] 97.3 F (36.3 C) (11/06 1205) Pulse Rate:  [60-131] 92 (11/06 1200) Resp:  [22-32] 23 (11/06 1200) BP: (75-138)/(30-62) 88/47 mmHg (11/06 1200) SpO2:  [63 %-100 %] 93 % (11/06 1200) FiO2 (%):  [100 %] 100 % (11/06 0800) Weight:  [143 lb 1.3 oz (64.9 kg)] 143 lb 1.3 oz (64.9 kg) (11/06 0629) Last BM Date: 12/09/13 General:  Alert, frail elderly white female, pleasant and cooperative in NAD Head:  Normocephalic and atraumatic. Eyes:  Sclera clear, no icterus.  Conjunctiva pink. Ears:  Normal auditory acuity. Mouth:  No deformity or lesions.   Lungs:  Coarse BS noted. Heart:  Regular.  No murmurs noted. Abdomen:  Soft, non-distended.  BS present.  Non-tender.  Rectal:  Deferred  Msk:  Symmetrical without gross deformities. Pulses:  Normal pulses noted. Extremities:  Without clubbing or edema. Neurologic:  Alert and oriented x 4;  grossly normal neurologically. Skin:  Intact without significant lesions or rashes. Psych:  Alert and cooperative. Normal mood and affect.  Intake/Output from previous day: 11/05 0701 - 11/06 0700 In: 3264.3 [P.O.:1074; I.V.:1890.3; IV Piggyback:300] Out: -  Intake/Output this shift: Total I/O In: 610 [I.V.:410; IV Piggyback:200] Out: 1300 [Urine:1300]  Lab Results:  Recent Labs  12/07/13 0318 12/08/13 0355 12/09/13 0510  WBC 18.2* 16.4* 18.8*  HGB 11.0* 11.5* 11.8*  HCT 33.2* 33.8* 35.1*  PLT 296 320 389   BMET  Recent Labs  12/07/13 0318 12/08/13 0355 12/09/13 0510  NA 140 136* 139  K 4.0 3.5*  3.7  CL 104 102 103  CO2 24 21 20   GLUCOSE 139* 148* 247*  BUN 20 18 15   CREATININE 1.05 0.89 0.79  CALCIUM 8.4 7.9*  8.2*   Studies/Results: Dg Chest 2 View  12/08/2013   CLINICAL DATA:  Productive cough, nausea and vomiting, and recent pneumonia.  EXAM: CHEST  2 VIEW  COMPARISON:  12/06/2013  FINDINGS: The cardiac silhouette remains enlarged. The patient has taken a shallower inspiration than on the prior study. Right lower lobe airspace opacity has increased. Small right pleural effusion has also increased in size. Streaky retrocardiac opacity is present in the left lower lobe. No definite left pleural effusion or pneumothorax is identified. VP shunt catheter tubing is noted coursing to the abdomen. Aortic calcification is noted. Grade 1/2 anterolisthesis is again seen near the cervicothoracic junction.  IMPRESSION: 1. Increasing right lower lobe consolidation concerning for worsening pneumonia. Mildly increased size of small right pleural effusion. Continued radiographic follow-up recommended. 2. Subsegmental atelectasis versus infiltrate in the left lower lobe.   Electronically Signed   By: Sebastian Ache   On: 12/08/2013 09:42   Dg Chest Port 1 View  12/09/2013   CLINICAL DATA:  Increasing shortness of breath this morning. Difficulty breathing.  EXAM: PORTABLE CHEST - 1 VIEW  COMPARISON:  12/08/2013  FINDINGS: Mild cardiac enlargement and central pulmonary vascular congestion. Right perihilar infiltration consistent with edema or pneumonia. This appears to be increasing since previous study. Small right pleural effusion is less prominent. This may be due to differences in patient positioning. No pneumothorax.  IMPRESSION: Cardiac enlargement with pulmonary vascular congestion. Increasing right perihilar infiltration or edema. Small right pleural effusion.   Electronically Signed   By: Burman Nieves M.D.   On: 12/09/2013 06:19    IMPRESSION:  -80 year old female with esophageal stricture and recent food impaction that was removed on 10/13 with plans for dilation in near future.  Now being admitted for suspected  aspiration PNA and weakness/falls likely from patient not eating well recently.  Also had Class D esophagitis on EGD as well.  PLAN: -Will likely need EGD with dilation as inpatient once she settles down some from pulmonary standpoint and her BP is stabilized. -Continue protonix IV BID. -Consider barium esophagram vs speech pathology evaluation with MBSS to reassess the esophagus in the interim (had severe esophagitis on last EGD) and to rule out oropharyngeal dysphagia.   Jaimeson Gopal D.  12/09/2013, 2:34 PM  Pager number [phone]

## 2013-12-09 NOTE — Progress Notes (Signed)
  Echocardiogram 2D Echocardiogram has been performed.  Katrina Flynn, Katrina Flynn 12/09/2013, 12:58 PM

## 2013-12-09 NOTE — Progress Notes (Signed)
PT Cancellation Note  Patient Details Name: Katrina Flynn MRN: [id number] DOB: [birth year]   Cancelled Treatment:    Reason Eval/Treat Not Completed: Medical issues which prohibited therapy. RN recommeded PT be held on today. Will check back another day.    Rebeca AlertJannie Joliyah Lippens, MPT Pager: [phone]

## 2013-12-09 NOTE — Consult Note (Signed)
PULMONARY / CRITICAL CARE MEDICINE   Name: Katrina Flynn MRN: [id number] DOB: 10/06/1922    ADMISSION DATE:  12/06/2013 CONSULTATION DATE:  11/6  REFERRING MD :  Jarold Motto  CHIEF COMPLAINT:  Cough  INITIAL PRESENTATION: Falls and with cough  STUDIES:  11/6 2 d >>  SIGNIFICANT EVENTS: 11/6 PCCM consult   HISTORY OF PRESENT ILLNESS:    80 yo with progressive decline over several months, multiple falls , esophogeal stricture , recent aspiration with presumed chronic aspiration. She was admitted 11/03 with falls, genral decline, PAF, cough and presumed continued aspiration and radiographic evidence of rt perihilar  Asdz. Suspected aspiration. PCCM asked to consult. PAST MEDICAL HISTORY :   has a past medical history of Hypertension; Hyperlipidemia; Hydrocephalus (1990's); Complication of anesthesia; PONV (postoperative nausea and vomiting); and Arthritis.  has past surgical history that includes CSF shunt; Fracture surgery; and Esophagogastroduodenoscopy (N/A, 11/15/2013). Prior to Admission medications   Medication Sig Start Date End Date Taking? Authorizing Provider  ALPRAZolam Prudy Feeler) 0.5 MG tablet Take 0.5 mg by mouth at bedtime as needed for anxiety.   Yes Historical Provider, MD  amLODipine (NORVASC) 2.5 MG tablet Take 2.5 mg by mouth every morning.  04/13/13  Yes Historical Provider, MD  atorvastatin (LIPITOR) 10 MG tablet Take 10 mg by mouth every morning.  04/13/13  Yes Historical Provider, MD  CALCIUM PO Take 1 tablet by mouth 2 (two) times daily.   Yes Historical Provider, MD  Fish Oil OIL Take 1 capsule by mouth every morning.    Yes Historical Provider, MD  losartan (COZAAR) 100 MG tablet Take 100 mg by mouth every morning.  04/13/13  Yes Historical Provider, MD  Multiple Vitamin (MULTIVITAMIN WITH MINERALS) TABS tablet Take 1 tablet by mouth every morning.    Yes Historical Provider, MD  pantoprazole (PROTONIX) 40 MG tablet Take 40 mg by mouth 2 (two) times daily.   Yes  Historical Provider, MD  Multiple Vitamins-Minerals (ICAPS MV PO) Take 1 tablet by mouth every morning.     Historical Provider, MD   No Known Allergies  FAMILY HISTORY:  has no family status information on file.  SOCIAL HISTORY:  reports that she has never smoked. She has never used smokeless tobacco. She reports that she does not drink alcohol.  REVIEW OF SYSTEMS:  10 point review of system taken, please see HPI for positives and negatives.   SUBJECTIVE:  Coughing continuously   VITAL SIGNS: Temp:  [97.3 F (36.3 C)-98.4 F (36.9 C)] 97.3 F (36.3 C) (11/06 1205) Pulse Rate:  [60-131] 92 (11/06 1200) Resp:  [22-32] 23 (11/06 1200) BP: (75-138)/(30-62) 88/47 mmHg (11/06 1200) SpO2:  [63 %-100 %] 93 % (11/06 1200) FiO2 (%):  [100 %] 100 % (11/06 0800) Weight:  [143 lb 1.3 oz (64.9 kg)] 143 lb 1.3 oz (64.9 kg) (11/06 0629) HEMODYNAMICS:   VENTILATOR SETTINGS: Vent Mode:  [-]  FiO2 (%):  [100 %] 100 % INTAKE / OUTPUT:  Intake/Output Summary (Last 24 hours) at 12/09/13 1253 Last data filed at 12/09/13 1200  Gross per 24 hour  Intake 2847.33 ml  Output   1300 ml  Net 1547.33 ml    PHYSICAL EXAMINATION: General:  Frail elderly female, NAD at rest Neuro:  Follows commands. Appears intact HEENT:  Constant cough, no JVD/LAN Cardiovascular:  HSIR IR Afib with CVR Lungs: +rhonchi and wheezes Abdomen:  Soft + bs Musculoskeletal:  Intact Skin:  Wd no edema  LABS:  CBC  Recent Labs Lab 12/07/13  [id number] 12/08/13 0355 12/09/13 0510  WBC 18.2* 16.4* 18.8*  HGB 11.0* 11.5* 11.8*  HCT 33.2* 33.8* 35.1*  PLT 296 320 389   Coag's No results for input(s): APTT, INR in the last 168 hours. BMET  Recent Labs Lab 12/07/13 0318 12/08/13 0355 12/09/13 0510  NA 140 136* 139  K 4.0 3.5* 3.7  CL 104 102 103  CO2 24 21 20   BUN 20 18 15   CREATININE 1.05 0.89 0.79  GLUCOSE 139* 148* 247*   Electrolytes  Recent Labs Lab 12/07/13 0318 12/08/13 0355 12/09/13 0510   CALCIUM 8.4 7.9* 8.2*   Sepsis Markers  Recent Labs Lab 12/06/13 1330  LATICACIDVEN 1.24   ABG No results for input(s): PHART, PCO2ART, PO2ART in the last 168 hours. Liver Enzymes No results for input(s): AST, ALT, ALKPHOS, BILITOT, ALBUMIN in the last 168 hours. Cardiac Enzymes No results for input(s): TROPONINI, PROBNP in the last 168 hours. Glucose No results for input(s): GLUCAP in the last 168 hours.  Imaging Dg Chest 2 View  12/08/2013   CLINICAL DATA:  Productive cough, nausea and vomiting, and recent pneumonia.  EXAM: CHEST  2 VIEW  COMPARISON:  12/06/2013  FINDINGS: The cardiac silhouette remains enlarged. The patient has taken a shallower inspiration than on the prior study. Right lower lobe airspace opacity has increased. Small right pleural effusion has also increased in size. Streaky retrocardiac opacity is present in the left lower lobe. No definite left pleural effusion or pneumothorax is identified. VP shunt catheter tubing is noted coursing to the abdomen. Aortic calcification is noted. Grade 1/2 anterolisthesis is again seen near the cervicothoracic junction.  IMPRESSION: 1. Increasing right lower lobe consolidation concerning for worsening pneumonia. Mildly increased size of small right pleural effusion. Continued radiographic follow-up recommended. 2. Subsegmental atelectasis versus infiltrate in the left lower lobe.   Electronically Signed   By: Sebastian AcheAllen  Grady   On: 12/08/2013 09:42     ASSESSMENT / PLAN:  PULMONARY OETT A: Resp distress secondary to presumed acute on chronic aspiration. P:   Abx , see ID BD with xopenex NPO till GI evaluates  CARDIOVASCULAR CVL A:  PAF currently on dilt drip with order for amio drip hr 72. Amio dc'd per PCCM HTN P:  Rate control(dc amio , never started with hr 80) continua dilt drip as now npo. Consider Cards consult  RENAL Lab Results  Component Value Date   CREATININE 0.79 12/09/2013   CREATININE 0.89 12/08/2013    CREATININE 1.05 12/07/2013    A:   No acute issue P:     GASTROINTESTINAL A:   Esophogeal stricture Presumed aspiration GI protection She has EGD 10/16 with foreign body removal.   P:   GI evaluation NPO till GI sees  HEMATOLOGIC A:   No acute issues P:    INFECTIOUS A:   Presumed rt pna P:   BCx2 11/3 >> UC 11/3>>neg Sputum GNF:AOZHYQMVAbx:Cefepime , start date11/4, day 2/x Abx: Vanc, start date11/5, day 1/x   ENDOCRINE A:   No acute process P:     NEUROLOGIC A:   Rass +1, follows commands P:   RASS goal: +1 SDU for monitoring to prevent falls   FAMILY  - Updates: Son updated at bedside 11/6 1300.  - Inter-disciplinary family meet or Palliative Care meeting due by:  day 7    TODAY'S SUMMARY:  80 yo with progressive decline over several months, multiple falls , esophogeal stricture , recent aspiration with presumed chronic aspiration. She  was admitted 11/03 with falls, genral decline, PAF, cough and presumed continued aspiration and radiographic evidence of rt perihilar  Asdz. Suspected aspiration. PCCM asked to consult.  Brett CanalesSteve Minor ACNP [facility] Pager [phone] till 3 pm If no answer page [phone] 12/09/2013, 12:53 PM  Staff Note: Elderly female, has been declining for almost a year now with multiple falls, strictures and recent aspiration PNA.  Failing swallow evaluation.  Admitted for failure to thrive.  PCCM consulted for hypotension and a-fib with RVR.  Patient noted on fluid and lasix, lasix was stopped due to hypotension and IVF stopped due to pulmonary edema.  Amiodarone for HR control stopped.  Continue dilt as BP is soft but not detrimental for now and seems to be effective.  Hold in the ICU for monitoring.  CC time 45 min.  Patient seen and examined, agree with above note.  I dictated the care and orders written for this patient under my direction.  Alyson ReedyWesam G. Yacoub, M.D. [facility]. Pager: [phone]. After  hours pager: [phone].

## 2013-12-09 NOTE — Significant Event (Addendum)
Rapid Response Event Note  Overview: Time Called: 0550 Arrival Time: 0600 Event Type: Respiratory  Initial Focused Assessment:Pt in respiratory distress, alert.    Interventions:02 increased to NRB and CXR done trasferred to 1223   Event Summary: at 0615 Dr. Jarold MottoPatterson notified    at    Outcome: Transferred (Comment)  Event End Time: 0620  Bayard HuggerDenny, Shamila Lerch B

## 2013-12-09 NOTE — Progress Notes (Signed)
Found patient this am in respiratory distress with increased respirations and rhonchi/ wheezes.  Vitals revealed that the oxygen saturation was 63% on 3L.  Notified Rapid Response nurse, respiratory therapist and MD on call. Gave breathing treatment, placed on 100% NRB and sats came up to 80%.  Order received to transfer pt to step-down.

## 2013-12-09 NOTE — Progress Notes (Signed)
SLP Cancellation Note  Patient Details Name: Katrina Flynn MRN: [id number] DOB: 1922-07-22   Cancelled treatment:       Reason Eval/Treat Not Completed: Medical issues which prohibited therapy (note events of yesterday and pt now in ICU on non-rebreather 100%)   Donavan Burnetamara Jannelle Notaro, MS [facility] SLP [phone]

## 2013-12-09 NOTE — Progress Notes (Signed)
Subjective: Events of earlier today noted, she is currently comfortable lying in bed at 45 degree elevation of HOB on 100 % oxygen facemask.  Objective: Vital signs in last 24 hours: Temp:  [97.7 F (36.5 C)-98.6 F (37 C)] 97.9 F (36.6 C) (11/06 0629) Pulse Rate:  [52-105] 105 (11/06 0629) Resp:  [22-31] 31 (11/06 0629) BP: (109-126)/(40-58) 109/40 mmHg (11/06 0629) SpO2:  [63 %-100 %] 100 % (11/06 0629) FiO2 (%):  [100 %] 100 % (11/06 0629) Weight:  [64.9 kg (143 lb 1.3 oz)] 64.9 kg (143 lb 1.3 oz) (11/06 0629) Weight change:    Intake/Output from previous day: 11/05 0701 - 11/06 0700 In: 2584.3 [P.O.:1074; I.V.:1210.3; IV Piggyback:300] Out: -    General appearance: alert, cooperative and no distress Resp: crackles throughout posterior right lung, no accessory respiratory muscle use Cardio: somewhat irregular rhythm without murmur or gallop GI: soft, non-tender; bowel sounds normal; no masses,  no organomegaly Extremities: extremities normal, atraumatic, no cyanosis or edema and she was alert and answered questions appropriately telemetry shows alternating sinus beats with atrial fibrillation  Lab Results:  Recent Labs  12/08/13 0355 12/09/13 0510  WBC 16.4* 18.8*  HGB 11.5* 11.8*  HCT 33.8* 35.1*  PLT 320 389   BMET  Recent Labs  12/08/13 0355 12/09/13 0510  NA 136* 139  K 3.5* 3.7  CL 102 103  CO2 21 20  GLUCOSE 148* 247*  BUN 18 15  CREATININE 0.89 0.79  CALCIUM 7.9* 8.2*   CMET CMP     Component Value Date/Time   NA 139 12/09/2013 0510   K 3.7 12/09/2013 0510   CL 103 12/09/2013 0510   CO2 20 12/09/2013 0510   GLUCOSE 247* 12/09/2013 0510   BUN 15 12/09/2013 0510   CREATININE 0.79 12/09/2013 0510   CALCIUM 8.2* 12/09/2013 0510   GFRNONAA 72* 12/09/2013 0510   GFRAA 83* 12/09/2013 0510    CBG (last 3)  No results for input(s): GLUCAP in the last 72 hours.  INR RESULTS:  No results found for: INR, PROTIME   Studies/Results: Dg  Chest 2 View  12/08/2013   CLINICAL DATA:  Productive cough, nausea and vomiting, and recent pneumonia.  EXAM: CHEST  2 VIEW  COMPARISON:  12/06/2013  FINDINGS: The cardiac silhouette remains enlarged. The patient has taken a shallower inspiration than on the prior study. Right lower lobe airspace opacity has increased. Small right pleural effusion has also increased in size. Streaky retrocardiac opacity is present in the left lower lobe. No definite left pleural effusion or pneumothorax is identified. VP shunt catheter tubing is noted coursing to the abdomen. Aortic calcification is noted. Grade 1/2 anterolisthesis is again seen near the cervicothoracic junction.  IMPRESSION: 1. Increasing right lower lobe consolidation concerning for worsening pneumonia. Mildly increased size of small right pleural effusion. Continued radiographic follow-up recommended. 2. Subsegmental atelectasis versus infiltrate in the left lower lobe.   Electronically Signed   By: Sebastian AcheAllen  Grady   On: 12/08/2013 09:42   Dg Chest Port 1 View  12/09/2013   CLINICAL DATA:  Increasing shortness of breath this morning. Difficulty breathing.  EXAM: PORTABLE CHEST - 1 VIEW  COMPARISON:  12/08/2013  FINDINGS: Mild cardiac enlargement and central pulmonary vascular congestion. Right perihilar infiltration consistent with edema or pneumonia. This appears to be increasing since previous study. Small right pleural effusion is less prominent. This may be due to differences in patient positioning. No pneumothorax.  IMPRESSION: Cardiac enlargement with pulmonary vascular congestion. Increasing  right perihilar infiltration or edema. Small right pleural effusion.   Electronically Signed   By: Burman NievesWilliam  Stevens M.D.   On: 12/09/2013 06:19    Medications: I have reviewed the patient's current medications.  Assessment/Plan: #1 Pneumonia: worsening right sided pneumonia on chest X-ray despite broad spectrum antibiotic rx. Airspace disease could be partially  from CHF due to diastolic dysfunction. Will request input from critical medicine in an effort to prevent her from further respiratory decline. Will add Lasix and aldactone for now.  #2 Atrial fibrillation: intermittent afib, will add amiodarone #3 Protein calorie malnutrition: severe and supplements have been added. She has an esophageal stricture and will need dilatation when more stable  LOS: 3 days   Athel Merriweather G 12/09/2013, 7:02 AM

## 2013-12-09 NOTE — Plan of Care (Signed)
Problem: Phase II Progression Outcomes Goal: Encourage coughing & deep breathing Outcome: Completed/Met Date Met:  12/09/13

## 2013-12-09 NOTE — Plan of Care (Signed)
Problem: Phase I Progression Outcomes Goal: OOB as tolerated unless otherwise ordered Outcome: Completed/Met Date Met:  12/09/13

## 2013-12-10 ENCOUNTER — Inpatient Hospital Stay (HOSPITAL_COMMUNITY): Payer: Medicare Other

## 2013-12-10 DIAGNOSIS — J690 Pneumonitis due to inhalation of food and vomit: Secondary | ICD-10-CM

## 2013-12-10 DIAGNOSIS — I4891 Unspecified atrial fibrillation: Secondary | ICD-10-CM

## 2013-12-10 LAB — BASIC METABOLIC PANEL
Anion gap: 14 (ref 5–15)
BUN: 13 mg/dL (ref 6–23)
CHLORIDE: 101 meq/L (ref 96–112)
CO2: 25 meq/L (ref 19–32)
CREATININE: 0.73 mg/dL (ref 0.50–1.10)
Calcium: 7.9 mg/dL — ABNORMAL LOW (ref 8.4–10.5)
GFR calc non Af Amer: 74 mL/min — ABNORMAL LOW (ref >90)
GFR, EST AFRICAN AMERICAN: 85 mL/min — AB (ref >90)
Glucose, Bld: 127 mg/dL — ABNORMAL HIGH (ref 70–99)
Potassium: 3.2 mEq/L — ABNORMAL LOW (ref 3.7–5.3)
Sodium: 140 mEq/L (ref 137–147)

## 2013-12-10 LAB — CBC
HEMATOCRIT: 35 % — AB (ref 36.0–46.0)
HEMOGLOBIN: 12.1 g/dL (ref 12.0–15.0)
MCH: 31.2 pg (ref 26.0–34.0)
MCHC: 34.6 g/dL (ref 30.0–36.0)
MCV: 90.2 fL (ref 78.0–100.0)
Platelets: 389 10*3/uL (ref 150–400)
RBC: 3.88 MIL/uL (ref 3.87–5.11)
RDW: 12.8 % (ref 11.5–15.5)
WBC: 17.1 10*3/uL — ABNORMAL HIGH (ref 4.0–10.5)

## 2013-12-10 LAB — PRO B NATRIURETIC PEPTIDE: Pro B Natriuretic peptide (BNP): 9790 pg/mL — ABNORMAL HIGH (ref 0–450)

## 2013-12-10 MED ORDER — KCL IN DEXTROSE-NACL 40-5-0.45 MEQ/L-%-% IV SOLN
INTRAVENOUS | Status: DC
  Administered 2013-12-10: 50 mL/h via INTRAVENOUS
  Filled 2013-12-10 (×2): qty 1000

## 2013-12-10 MED ORDER — DILTIAZEM HCL 100 MG IV SOLR
5.0000 mg/h | INTRAVENOUS | Status: DC
  Administered 2013-12-10 – 2013-12-11 (×2): 5 mg/h via INTRAVENOUS
  Filled 2013-12-10: qty 100

## 2013-12-10 MED ORDER — SUCRALFATE 1 GM/10ML PO SUSP
1.0000 g | Freq: Three times a day (TID) | ORAL | Status: DC
Start: 2013-12-10 — End: 2013-12-14
  Administered 2013-12-10 – 2013-12-14 (×13): 1 g via ORAL
  Filled 2013-12-10 (×18): qty 10

## 2013-12-10 NOTE — Procedures (Signed)
Objective Swallowing Evaluation: Modified Barium Swallowing Study  Patient Details  Name: Katrina Flynn MRN: [id number] Date of Birth: March 10, 1922  Today's Date: 12/10/2013 Time: [id number] SLP Time Calculation (min): 35 min  Past Medical History:  Past Medical History  Diagnosis Date  . Hypertension   . Hyperlipidemia   . Hydrocephalus 1990's  . Complication of anesthesia   . PONV (postoperative nausea and vomiting)   . Arthritis     hands   Past Surgical History:  Past Surgical History  Procedure Laterality Date  . Csf shunt    . Fracture surgery      lt knee, rt elbow  . Esophagogastroduodenoscopy N/A 11/15/2013    Procedure: ESOPHAGOGASTRODUODENOSCOPY (EGD);  Surgeon: Louis Meckelobert D Kaplan, MD;  Location[facility];  Service: Endoscopy;  Laterality: N/A;   HPI:  Mrs. Katrina Flynn is an 80 year old white female with a history of hydrocephalus status post VP shunt, chronic gait instability who presented to the emergency department with increased weakness and fall 2. She's had a progressive decline in her functional status over the past several weeks. This started about 3-4 weeks ago when she choked while eating dinner with possible aspiration.  She underwent an EGD on 10/13 that showed an esophageal stricture with retained food extraction- started on soft diet with plans to dilate the esophagus soon.  Her son reports poor po intake since that time.  Had some blood tinged sputum which has resolved.  No cough. She has had a progressive weakness prompting them to start PT [facility] last week.  Despite this, she continued to become increasingly weak and fell twice this morning.  Her son also felt like she seemed more lethargic with increased confusion, issues with her balance, and possible slurred speech.  No focal weakness but was concerned about the possibility of a stroke so he brought her to the emergency department. In the emergency department her head CT shows no acute abnormalities or  intracranial hemorrhage. Additional workup revealed an elevated white blood count,probable right lower lobe pneumonia on chest x-ray.  SLP spoke with PA Doug SouJessica Zehr earlier today regarding speech assessment. BSE performed on 12/07/13 was inadvertently overlooked during that conversation. Given concern for aspiration in addition to known esophageal issues, will proceed with MBS today.     Assessment / Plan / Recommendation Clinical Impression  Dysphagia Diagnosis: Mild pharyngeal phase dysphagia    Treatment Recommendation  Therapy as outlined in treatment plan below    Diet Recommendation Dysphagia 2 (Fine chop);Thin liquid   Liquid Administration via: Straw;Cup Medication Administration: Crushed with puree Supervision: Patient able to self feed;Full supervision/cueing for compensatory strategies Compensations: Slow rate;Small sips/bites;Follow solids with liquid Postural Changes and/or Swallow Maneuvers: Seated upright 90 degrees;Upright 30-60 min after meal    Other  Recommendations Recommended Consults: MBS Oral Care Recommendations: Oral care BID Other Recommendations: Clarify dietary restrictions   Follow Up Recommendations  24 hour supervision/assistance    Frequency and Duration min 1 x/week  1 week   Pertinent Vitals/Pain VSS - RN present during study. No pain reported    SLP Swallow Goals  diet tolerance, adherence to safe swallow precautions   General Date of Onset: 11/15/13 HPI: Mrs. Katrina Flynn is an 80 year old white female with a history of hydrocephalus status post VP shunt, chronic gait instability who presented to the emergency department with increased weakness and fall 2. She's had a progressive decline in her functional status over the past several weeks. This started about 3-4 weeks ago when she choked  while eating dinner with possible aspiration.  She underwent an EGD on 10/13 that showed an esophageal stricture with retained food extraction- started on soft diet  with plans to dilate the esophagus soon.  Her son reports poor po intake since that time.  Had some blood tinged sputum which has resolved.  No cough. She has had a progressive weakness prompting them to start PT at [facility] last week.  Despite this, she continued to become increasingly weak and fell twice this morning.  Her son also felt like she seemed more lethargic with increased confusion, issues with her balance, and possible slurred speech.  No focal weakness but was concerned about the possibility of a stroke so he brought her to the emergency department. In the emergency department her head CT shows no acute abnormalities or intracranial hemorrhage. Additional workup revealed an elevated white blood count,probable right lower lobe pneumonia on chest x-ray.  SLP spoke with PA Doug Sou earlier today regarding speech assessment. BSE performed on 12/07/13 was inadvertently overlooked during that conversation. Given concern for aspiration in addition to known esophageal issues, will proceed with MBS today. Type of Study: Modified Barium Swallowing Study Reason for Referral: Objectively evaluate swallowing function Previous Swallow Assessment: BSE 12/10/13. Diet Prior to this Study: NPO Temperature Spikes Noted: No Respiratory Status: Nasal cannula History of Recent Intubation: No Behavior/Cognition: Alert;Cooperative;Distractible;Requires cueing Oral Cavity - Dentition: Adequate natural dentition Self-Feeding Abilities: Able to feed self;Needs assist;Needs set up Patient Positioning: Upright in chair Baseline Vocal Quality: Hoarse Volitional Cough: Strong Volitional Swallow: Able to elicit Anatomy: Within functional limits Pharyngeal Secretions: Not observed secondary MBS    Reason for Referral Objectively evaluate swallowing function   Oral Phase Oral Preparation/Oral Phase Oral Phase: Impaired Oral - Nectar Oral - Nectar Cup: Piecemeal swallowing;Lingual/palatal residue Oral -  Thin Oral - Thin Cup: Piecemeal swallowing;Lingual/palatal residue Oral - Thin Straw: Piecemeal swallowing;Lingual/palatal residue Oral - Solids Oral - Puree: Piecemeal swallowing;Lingual/palatal residue Oral - Multi-consistency: Piecemeal swallowing;Lingual/palatal residue Oral - Pill: Reduced posterior propulsion;Lingual/palatal residue   Pharyngeal Phase Pharyngeal Phase Pharyngeal Phase: Impaired Pharyngeal - Nectar Pharyngeal - Nectar Cup: Delayed swallow initiation;Premature spillage to valleculae;Premature spillage to pyriform sinuses;Pharyngeal residue - pyriform sinuses;Pharyngeal residue - valleculae;Pharyngeal residue - posterior pharnyx;Reduced pharyngeal peristalsis;Reduced epiglottic inversion;Reduced airway/laryngeal closure;Penetration/Aspiration during swallow;Reduced tongue base retraction Penetration/Aspiration details (nectar cup): Material does not enter airway;Material enters airway, remains ABOVE vocal cords then ejected out Pharyngeal - Thin Pharyngeal - Thin Cup: Delayed swallow initiation;Premature spillage to valleculae;Premature spillage to pyriform sinuses;Reduced epiglottic inversion;Reduced airway/laryngeal closure;Penetration/Aspiration during swallow;Reduced tongue base retraction;Reduced pharyngeal peristalsis;Pharyngeal residue - posterior pharnyx Penetration/Aspiration details (thin cup): Material enters airway, remains ABOVE vocal cords then ejected out;Material does not enter airway Pharyngeal - Thin Straw: Delayed swallow initiation;Premature spillage to valleculae;Premature spillage to pyriform sinuses;Reduced epiglottic inversion;Penetration/Aspiration during swallow;Reduced airway/laryngeal closure;Reduced pharyngeal peristalsis;Reduced tongue base retraction;Pharyngeal residue - valleculae;Pharyngeal residue - pyriform sinuses;Pharyngeal residue - posterior pharnyx Penetration/Aspiration details (thin straw): Material enters airway, remains ABOVE vocal  cords then ejected out;Material does not enter airway Pharyngeal - Solids Pharyngeal - Puree: Reduced epiglottic inversion;Delayed swallow initiation;Premature spillage to valleculae;Pharyngeal residue - valleculae;Reduced tongue base retraction;Reduced pharyngeal peristalsis Pharyngeal - Multi-consistency: Reduced epiglottic inversion;Premature spillage to valleculae;Delayed swallow initiation;Reduced pharyngeal peristalsis;Reduced tongue base retraction;Pharyngeal residue - valleculae Pharyngeal - Pill: Reduced epiglottic inversion;Delayed swallow initiation;Premature spillage to valleculae;Pharyngeal residue - valleculae;Reduced pharyngeal peristalsis;Reduced tongue base retraction  Cervical Esophageal Phase    GO    Cervical Esophageal Phase Cervical Esophageal Phase: Impaired Cervical Esophageal Phase -  Solids Puree:  (stasis slow to clear cervical esophagus)        Vernessa Likes B. DeWittBueche, Proctor Community HospitalMSP, CCC-SLP [phone]  Leigh AuroraBueche, Neysa Arts Brown 12/10/2013, 4:35 PM

## 2013-12-10 NOTE — Progress Notes (Addendum)
Subjective: No events overnight.   Objective: Vital signs in last 24 hours: Temp:  [97.3 F (36.3 C)-97.8 F (36.6 C)] 97.5 F (36.4 C) (11/07 0400) Pulse Rate:  [92-157] 104 (11/07 0900) Resp:  [21-29] 29 (11/07 0900) BP: (75-135)/(30-79) 115/49 mmHg (11/07 0700) SpO2:  [91 %-96 %] 96 % (11/07 0900) Weight change:    Intake/Output from previous day: 11/06 0701 - 11/07 0700 In: 2180 [I.V.:1930; IV Piggyback:250] Out: 5400 [Urine:5400]   General appearance: alert, cooperative and no distress Resp: crackles throughout posterior right lung, no accessory respiratory muscle use Cardio: somewhat irregular rhythm without murmur or gallop GI: soft, non-tender; bowel sounds normal; no masses,  no organomegaly Extremities: extremities normal, atraumatic, no cyanosis or edema and she was alert and answered questions appropriately telemetry shows alternating sinus beats with atrial fibrillation  Lab Results:  Recent Labs  12/09/13 0510 12/10/13 0343  WBC 18.8* 17.1*  HGB 11.8* 12.1  HCT 35.1* 35.0*  PLT 389 389   BMET  Recent Labs  12/09/13 0510 12/10/13 0343  NA 139 140  K 3.7 3.2*  CL 103 101  CO2 20 25  GLUCOSE 247* 127*  BUN 15 13  CREATININE 0.79 0.73  CALCIUM 8.2* 7.9*   CMET CMP     Component Value Date/Time   NA 140 12/10/2013 0343   K 3.2* 12/10/2013 0343   CL 101 12/10/2013 0343   CO2 25 12/10/2013 0343   GLUCOSE 127* 12/10/2013 0343   BUN 13 12/10/2013 0343   CREATININE 0.73 12/10/2013 0343   CALCIUM 7.9* 12/10/2013 0343   GFRNONAA 74* 12/10/2013 0343   GFRAA 85* 12/10/2013 0343    CBG (last 3)  No results for input(s): GLUCAP in the last 72 hours.  INR RESULTS:  No results found for: INR, PROTIME   Studies/Results: Dg Chest Port 1 View  12/09/2013   CLINICAL DATA:  Increasing shortness of breath this morning. Difficulty breathing.  EXAM: PORTABLE CHEST - 1 VIEW  COMPARISON:  12/08/2013  FINDINGS: Mild cardiac enlargement and central  pulmonary vascular congestion. Right perihilar infiltration consistent with edema or pneumonia. This appears to be increasing since previous study. Small right pleural effusion is less prominent. This may be due to differences in patient positioning. No pneumothorax.  IMPRESSION: Cardiac enlargement with pulmonary vascular congestion. Increasing right perihilar infiltration or edema. Small right pleural effusion.   Electronically Signed   By: Burman NievesWilliam  Stevens M.D.   On: 12/09/2013 06:19    Medications: I have reviewed the patient's current medications.  Assessment/Plan: #1 Pneumonia: R sided PNA . On vanc/cefe . Aspiration being worked up by speech and GI . Currently NPO except meds and ice chips .  #2 Atrial fibrillation: new onset afib. Poor anticoag candidate. On dilt ggt at this time and will convert to PO once swallow status is worked up . amio not necessary at this time as rate controlled on dilt .  # aortic stenosis - conservative medical management at this time . otupatient work up after discharge  #3 Protein calorie malnutrition: severe and supplements have been added. She has an esophageal stricture and will need dilatation when more stable. GI on board  # dispo - to SNF when able. To tele bed today    LOS: 4 days   Taliesin Hartlage 12/10/2013, 10:39 AM

## 2013-12-10 NOTE — Progress Notes (Signed)
Patient HR elevates with minimal activity.  Patient taken to radiology for barium speech swallow and then taken to room 1224.  Patient is asymptomatic with HR of 130's with activity.

## 2013-12-10 NOTE — Progress Notes (Signed)
Received patient from ICU/SDU Cardizem drip ongoing at 7610ml/hr HR on 130's.

## 2013-12-10 NOTE — Progress Notes (Signed)
Notified Dr. Link SnufferHolwerda of HR of 130 after barium speech evaluation completed.  Also informed Dr. Link SnufferHolwerda of new room number 1424.

## 2013-12-10 NOTE — Progress Notes (Signed)
Report called to Jennie, RN.

## 2013-12-10 NOTE — Progress Notes (Signed)
Administered PO medications as per Dr. Link SnufferHolwerda.  Patient was unable to finish breeze, miralax or bene-fiber due to watery voice and coughing.  All medications except protonix (not crushable per Pharmacy) were crushed and placed in one teaspoon of applesauce.  The medications in applesauce were swallowed without apparent distress.

## 2013-12-10 NOTE — Evaluation (Signed)
Clinical/Bedside Swallow Evaluation Patient Details  Name: Katrina Flynn MRN: [id number] Date of Birth: [birth year]  Today's Date: 12/10/2013 Time: 1440-1500 SLP Time Calculation (min): 20 min  Past Medical History:  Past Medical History  Diagnosis Date  . Hypertension   . Hyperlipidemia   . Hydrocephalus 1990's  . Complication of anesthesia   . PONV (postoperative nausea and vomiting)   . Arthritis     hands   Past Surgical History:  Past Surgical History  Procedure Laterality Date  . Csf shunt    . Fracture surgery      lt knee, rt elbow  . Esophagogastroduodenoscopy N/A 11/15/2013    Procedure: ESOPHAGOGASTRODUODENOSCOPY (EGD);  Surgeon: Louis Meckelobert D Kaplan, MD;  [facility];  Service: Endoscopy;  Laterality: N/A;   HPI:  Mrs. Katrina Flynn is an 80 year old white female with a history of hydrocephalus status post VP shunt, chronic gait instability who presented to the emergency department with increased weakness and fall 2. She's had a progressive decline in her functional status over the past several weeks. This started about 3-4 weeks ago when she choked while eating dinner with possible aspiration.  She underwent an EGD on 10/13 that showed an esophageal stricture with retained food extraction- started on soft diet with plans to dilate the esophagus soon.  Her son reports poor po intake since that time.  Had some blood tinged sputum which has resolved.  No cough. She has had a progressive weakness prompting them to [facility].  Despite this, she continued to become increasingly weak and fell twice this morning.  Her son also felt like she seemed more lethargic with increased confusion, issues with her balance, and possible slurred speech.  No focal weakness but was concerned about the possibility of a stroke so he brought her to the emergency department. In the emergency department her head CT shows no acute abnormalities or intracranial hemorrhage.  Additional workup revealed an elevated white blood count,probable right lower lobe pneumonia on chest x-ray.  SLP spoke with PA Doug SouJessica Zehr earlier today regarding speech assessment. BSE performed on 12/07/13 was inadvertently overlooked during that conversation. Given concern for aspiration in addition to known esophageal issues, will proceed with MBS today.   Assessment / Plan / Recommendation Clinical Impression  SLP received phone call from PA Jessica Zehr this afternoon re: concern for pt swallow and NPO status. Pt has a known esophageal stricture, pending dilation, and is suspected to have aspiration pneumonia.  Similar to 12/07/13 BSE, pt appears to swallow normally, but continues to exhibit a rather loud audible swallow.  A primary esophageal dysphagia is suspected, but cannot r/o a secondary pharyngeal component with possible aspiration.  Will proceed with objective study of current swallow function and safety this afternoon.      Aspiration Risk  Moderate    Diet Recommendation  (pending MBS results)        Other  Recommendations Recommended Consults: MBS   Follow Up Recommendations       Frequency and Duration        Pertinent Vitals/Pain VSS, no pain indicated    SLP Swallow Goals   pending MBS results   Swallow Study Prior Functional Status   Known history of esophageal stricture.    General HPI: Mrs. Katrina Flynn is an 80 year old white female with a history of hydrocephalus status post VP shunt, chronic gait instability who presented to the emergency department with increased weakness and fall 2. She's had a progressive decline  in her functional status over the past several weeks. This started about 3-4 weeks ago when she choked while eating dinner with possible aspiration.  She underwent an EGD on 10/13 that showed an esophageal stricture with retained food extraction- started on soft diet with plans to dilate the esophagus soon.  Her son reports poor po intake since that  time.  Had some blood tinged sputum which has resolved.  No cough. She has had a progressive weakness prompting them to [facility].  Despite this, she continued to become increasingly weak and fell twice this morning.  Her son also felt like she seemed more lethargic with increased confusion, issues with her balance, and possible slurred speech.  No focal weakness but was concerned about the possibility of a stroke so he brought her to the emergency department. In the emergency department her head CT shows no acute abnormalities or intracranial hemorrhage. Additional workup revealed an elevated white blood count,probable right lower lobe pneumonia on chest x-ray.  SLP spoke with PA Doug SouJessica Zehr earlier today regarding speech assessment. BSE performed on 12/07/13 was inadvertently overlooked during that conversation. Given concern for aspiration in addition to known esophageal issues, will proceed with MBS today. Type of Study: Bedside swallow evaluation Previous Swallow Assessment: BSE 12/07/13. Diet Prior to this Study: NPO Temperature Spikes Noted: No Respiratory Status: Nasal cannula History of Recent Intubation: No Behavior/Cognition: Alert;Cooperative;Distractible;Requires cueing Oral Cavity - Dentition: Adequate natural dentition Patient Positioning: Upright in bed Baseline Vocal Quality: Hoarse Volitional Cough: Strong Volitional Swallow: Able to elicit    Oral/Motor/Sensory Function Overall Oral Motor/Sensory Function: Appears within functional limits for tasks assessed   Ice Chips Ice chips: Not tested   Thin Liquid Thin Liquid: Within functional limits Presentation: Cup Oral Phase Functional Implications:  (audible swallow noted again today.)    Nectar Thick Nectar Thick Liquid: Not tested   Honey Thick Honey Thick Liquid: Not tested   Puree Puree: Not tested   Solid   GO    Solid: Not tested      Clyde Upshaw B. Hazel DellBueche, MSP, CCC-SLP [phone]  Leigh AuroraBueche,  Zyair Russi Brown 12/10/2013,3:04 PM

## 2013-12-10 NOTE — Progress Notes (Signed)
     [facility] Progress Note  Subjective:  Patient resting comfortably.  No family here at bedside currently.  Apparently had watery voice and coughing with liquids this morning according to nursing note.  Objective:  Vital signs in last 24 hours: Temp:  [97.3 F (36.3 C)-97.8 F (36.6 C)] 97.5 F (36.4 C) (11/07 0400) Pulse Rate:  [95-157] 104 (11/07 0900) Resp:  [21-29] 29 (11/07 0900) BP: (90-135)/(31-79) 115/49 mmHg (11/07 0700) SpO2:  [91 %-96 %] 96 % (11/07 0900) Last BM Date: 12/10/13 General:  Alert, elderly and frail, in NAD Heart:  Tachy. Pulm:  Coarse BS noted. Abdomen:  Soft, non-distended.  BS present.  Non-tender. Extremities:  Without edema. Neurologic:  Alert and  oriented x 4;  grossly normal neurologically. Psych:  Alert and cooperative. Normal mood and affect.  Intake/Output from previous day: 11/06 0701 - 11/07 0700 In: 2180 [I.V.:1930; IV Piggyback:250] Out: 5400 [Urine:5400] Intake/Output this shift: Total I/O In: 210 [I.V.:160; IV Piggyback:50] Out: -   Lab Results:  Recent Labs  12/08/13 0355 12/09/13 0510 12/10/13 0343  WBC 16.4* 18.8* 17.1*  HGB 11.5* 11.8* 12.1  HCT 33.8* 35.1* 35.0*  PLT 320 389 389   BMET  Recent Labs  12/08/13 0355 12/09/13 0510 12/10/13 0343  NA 136* 139 140  K 3.5* 3.7 3.2*  CL 102 103 101  CO2 21 20 25   GLUCOSE 148* 247* 127*  BUN 18 15 13   CREATININE 0.89 0.79 0.73  CALCIUM 7.9* 8.2* 7.9*   Dg Chest Port 1 View  12/09/2013   CLINICAL DATA:  Increasing shortness of breath this morning. Difficulty breathing.  EXAM: PORTABLE CHEST - 1 VIEW  COMPARISON:  12/08/2013  FINDINGS: Mild cardiac enlargement and central pulmonary vascular congestion. Right perihilar infiltration consistent with edema or pneumonia. This appears to be increasing since previous study. Small right pleural effusion is less prominent. This may be due to differences in patient positioning. No pneumothorax.  IMPRESSION:  Cardiac enlargement with pulmonary vascular congestion. Increasing right perihilar infiltration or edema. Small right pleural effusion.   Electronically Signed   By: Burman NievesWilliam  Stevens M.D.   On: 12/09/2013 06:19   Assessment / Plan: -80 year old female with esophageal stricture and recent food impaction that was removed on 10/13 with plans for dilation in near future. Now admitted for suspected aspiration PNA and weakness/falls likely from patient not eating well recently. Also had Class D esophagitis on EGD as well.  *Will need eventual EGD with dilation as inpatient once she settles down some from pulmonary standpoint and her BP is stabilized, however, she needs bedside swallowing evaluation or MBSS by speech pathology to rule out oropharyngeal dysphagia as a component given aspiration and coughing.  We know she has a stricture and esophageal inflammation, but the narrowing should not cause liquid dysphagia or prevent soft foods from passing.  I spoke with speech pathology and she agrees that it does not look like bedside swallow was done; she will come see patient today and perform that study.    *Continue protonix IV BID. *Full barium swallow may also be beneficial, but would be cautious of this if she is aspirating on/not able to swallow liquids.   LOS: 4 days   Herbert Marken D.  12/10/2013, 12:02 PM  Pager number [phone]

## 2013-12-10 NOTE — Progress Notes (Signed)
Attempted to transfer patient from bed to wheelchair with two assist.  Patient did not move legs or feet, did not bear any weigh, and did not straighten out her legs.  The patient had to be sit back down in the bed.  The transfer to radiology and room 1224 was made via hospital bed.  Handoff report was called to Shanda BumpsJessica, Charity fundraiserN.

## 2013-12-10 NOTE — Progress Notes (Signed)
No distress Off vasopressors HR controlled on diltiazem gtt No complaints  Filed Vitals:   12/10/13 0500 12/10/13 0600 12/10/13 0700 12/10/13 0900  BP: 108/31 114/70 115/49   Pulse: 103 108 157 104  Temp:      TempSrc:      Resp: 25 22 23 29   Height:      Weight:      SpO2: 95% 91% 94% 96%   NAD Raspy voice quality HEENT WNL No JVD noted Few bibasilar crackles, no wheezes IRIR, no M noted NABS, soft Ext warm   I have reviewed all of today's lab results. Relevant abnormalities are discussed in the A/P section  CXR: NNF. Film from 11/06 reviewed  IMP: Acute hypoxic resp failure Asymmetric airspace disease on CXR edema vs PNA  Elevated BNP noted  Suspicion for aspiration based on history AFRVR, apparently new onset - rate now controlled Esophageal stricture - GI evaluating Hypotension - resolved   PLAN/REC: DC vanc Cont cefepime for now Complete 7 days abx - would be OK to switch to Augmentin or levofloxacin when able to take POs Recheck CXR in AM 11/08 to f/u infiltrates Consider Cards eval for new onset AF  Probably a poor candidate for long term anticoagulation IVFs adjusted - avoid volume overload Probably OK for telemetry today or tomorrow after transition off diltiazem gtt to PO  PCCM will sign off. Please call if we can be of further assistance  Billy Fischeravid Simonds, MD ; [facility] Mobile [phone].  After 5:30 PM or weekends, call [phone]

## 2013-12-11 DIAGNOSIS — E46 Unspecified protein-calorie malnutrition: Secondary | ICD-10-CM

## 2013-12-11 LAB — BASIC METABOLIC PANEL
Anion gap: 12 (ref 5–15)
BUN: 20 mg/dL (ref 6–23)
CHLORIDE: 99 meq/L (ref 96–112)
CO2: 28 mEq/L (ref 19–32)
CREATININE: 0.99 mg/dL (ref 0.50–1.10)
Calcium: 8.2 mg/dL — ABNORMAL LOW (ref 8.4–10.5)
GFR calc Af Amer: 57 mL/min — ABNORMAL LOW (ref >90)
GFR calc non Af Amer: 49 mL/min — ABNORMAL LOW (ref >90)
GLUCOSE: 146 mg/dL — AB (ref 70–99)
POTASSIUM: 3.4 meq/L — AB (ref 3.7–5.3)
Sodium: 139 mEq/L (ref 137–147)

## 2013-12-11 LAB — CBC
HCT: 35.4 % — ABNORMAL LOW (ref 36.0–46.0)
HEMOGLOBIN: 12.4 g/dL (ref 12.0–15.0)
MCH: 31.9 pg (ref 26.0–34.0)
MCHC: 35 g/dL (ref 30.0–36.0)
MCV: 91 fL (ref 78.0–100.0)
Platelets: 423 10*3/uL — ABNORMAL HIGH (ref 150–400)
RBC: 3.89 MIL/uL (ref 3.87–5.11)
RDW: 12.9 % (ref 11.5–15.5)
WBC: 13.5 10*3/uL — ABNORMAL HIGH (ref 4.0–10.5)

## 2013-12-11 MED ORDER — DILTIAZEM HCL 30 MG PO TABS
30.0000 mg | ORAL_TABLET | Freq: Four times a day (QID) | ORAL | Status: DC
  Administered 2013-12-11 – 2013-12-14 (×12): 30 mg via ORAL
  Filled 2013-12-11 (×19): qty 1

## 2013-12-11 NOTE — Plan of Care (Signed)
Problem: Phase II Progression Outcomes Goal: Tolerating diet Outcome: Progressing     

## 2013-12-11 NOTE — Progress Notes (Signed)
Subjective: No events overnight.   Objective: Vital signs in last 24 hours: Temp:  [97.7 F (36.5 C)-97.8 F (36.6 C)] 97.7 F (36.5 C) (11/08 0519) Pulse Rate:  [27-153] 87 (11/08 0519) Resp:  [18-42] 18 (11/08 0519) BP: (98-129)/(51-77) 99/63 mmHg (11/08 0519) SpO2:  [90 %-99 %] 96 % (11/08 0519) Weight:  [143 lb 1.3 oz (64.9 kg)] 143 lb 1.3 oz (64.9 kg) (11/07 1626) Weight change:    Intake/Output from previous day: 11/07 0701 - 11/08 0700 In: 585 [P.O.:120; I.V.:365; IV Piggyback:100] Out: 2150 [Urine:2150]   General appearance: alert, cooperative and no distress Resp: crackles throughout posterior right lung, no accessory respiratory muscle use Cardio: somewhat irregular rhythm without murmur or gallop GI: soft, non-tender; bowel sounds normal; no masses,  no organomegaly Extremities: extremities normal, atraumatic, no cyanosis or edema and she was alert and answered questions appropriately telemetry shows alternating sinus beats with atrial fibrillation  Lab Results:  Recent Labs  12/10/13 0343 12/11/13 0505  WBC 17.1* 13.5*  HGB 12.1 12.4  HCT 35.0* 35.4*  PLT 389 423*   BMET  Recent Labs  12/10/13 0343 12/11/13 0505  NA 140 139  K 3.2* 3.4*  CL 101 99  CO2 25 28  GLUCOSE 127* 146*  BUN 13 20  CREATININE 0.73 0.99  CALCIUM 7.9* 8.2*   CMET CMP     Component Value Date/Time   NA 139 12/11/2013 0505   K 3.4* 12/11/2013 0505   CL 99 12/11/2013 0505   CO2 28 12/11/2013 0505   GLUCOSE 146* 12/11/2013 0505   BUN 20 12/11/2013 0505   CREATININE 0.99 12/11/2013 0505   CALCIUM 8.2* 12/11/2013 0505   GFRNONAA 49* 12/11/2013 0505   GFRAA 57* 12/11/2013 0505    CBG (last 3)  No results for input(s): GLUCAP in the last 72 hours.  INR RESULTS:  No results found for: INR, PROTIME   Studies/Results: No results found.  Medications: I have reviewed the patient's current medications.  Assessment/Plan: #1 Pneumonia: R sided PNA . On vanc/cefe ,  finally showing some improvement in leukocytosis today, consider deescalation to levaquin tomorrow if continues. Mild pharyngeal dysphagia noted by speech, on appropriate diet at this time #2 Atrial fibrillation: new onset afib. Poor anticoag candidate. Converting dilt IV to PO today. Per cards conversation, as long as responding to dilt, no need to consider amio .  # aortic stenosis - conservative medical management at this time . otupatient work up after discharge  #3 Protein calorie malnutrition: severe and supplements have been added. She has an esophageal stricture and will need dilatation when more stable. GI on board  # dispo - to SNF when able    LOS: 5 days   Katrina Flynn 12/11/2013, 9:08 AM

## 2013-12-11 NOTE — Progress Notes (Signed)
     [facility] Progress Note  Subjective:  Had modified barium swallow. Okay for dysphagia 2 diet with thin liquids. Diet started by speech pathology. She attempted to take a pill during the modified barium swallow but the pill never past the vallecula. Speech pathology noted some mild stasis in the proximal esophagus which cleared quickly.   She feels better.  Not coughing much at all.  Objective:  Vital signs in last 24 hours: Temp:  [97.7 F (36.5 C)-97.8 F (36.6 C)] 97.7 F (36.5 C) (11/08 0519) Pulse Rate:  [27-153] 87 (11/08 0519) Resp:  [18-42] 18 (11/08 0519) BP: (98-129)/(51-77) 99/63 mmHg (11/08 0519) SpO2:  [90 %-99 %] 96 % (11/08 0519) Weight:  [143 lb 1.3 oz (64.9 kg)] 143 lb 1.3 oz (64.9 kg) (11/07 1626) Last BM Date: 12/10/13 General:  Alert, elderly and frail, in NAD Heart:  Irregular. Pulm:  Some crackles noted in right lung posteriorly. Abdomen:  Soft, non-distended.  BS present.  Non-tender. Extremities:  Without edema. Neurologic:  Alert and  oriented x4;  grossly normal neurologically. Psych:  Alert and cooperative. Normal mood and affect.  Intake/Output from previous day: 11/07 0701 - 11/08 0700 In: 585 [P.O.:120; I.V.:365; IV Piggyback:100] Out: 2150 [Urine:2150]  Lab Results:  Recent Labs  12/09/13 0510 12/10/13 0343 12/11/13 0505  WBC 18.8* 17.1* 13.5*  HGB 11.8* 12.1 12.4  HCT 35.1* 35.0* 35.4*  PLT 389 389 423*   BMET  Recent Labs  12/09/13 0510 12/10/13 0343 12/11/13 0505  NA 139 140 139  K 3.7 3.2* 3.4*  CL 103 101 99  CO2 20 25 28   GLUCOSE 247* 127* 146*  BUN 15 13 20   CREATININE 0.79 0.73 0.99  CALCIUM 8.2* 7.9* 8.2*   Assessment / Plan: -80 year old female with esophageal stricture and recent food impaction that was removed on 10/13 with plans for dilation in near future. Now admitted for suspected aspiration PNA and weakness/falls likely from patient not eating well recently. Also had Class D esophagitis  on EGD as well.  *Continue protonix IV BID. *Will plan for EGD to re-evaluate the esophagus either 11/9 or 11/10.  Dilation will not be performed unless inflammation is much improved. *Continue dietary recommendations made by speech for now.   LOS: 5 days   Skyelar Swigart D.  12/11/2013, 9:23 AM  Pager number [phone]

## 2013-12-12 LAB — CULTURE, BLOOD (ROUTINE X 2)
CULTURE: NO GROWTH
Culture: NO GROWTH

## 2013-12-12 MED ORDER — SODIUM CHLORIDE 0.9 % IV SOLN
INTRAVENOUS | Status: DC
  Administered 2013-12-13: via INTRAVENOUS

## 2013-12-12 MED ORDER — SODIUM CHLORIDE 0.9 % IV SOLN: INTRAVENOUS | Status: DC

## 2013-12-12 NOTE — Progress Notes (Signed)
ANTIBIOTIC CONSULT NOTE - Follow Up  Pharmacy Consult for Cefepime Indication: pneumonia  No Known Allergies  Patient Measurements: Height: 5\' 6"  (167.6 cm) Weight: 143 lb 1.3 oz (64.9 kg) IBW/kg (Calculated) : 59.3  Vital Signs: Temp: 97.8 F (36.6 C) (11/09 0909) Temp Source: Oral (11/09 0909) BP: 125/59 mmHg (11/09 0909) Pulse Rate: 85 (11/09 0522) Intake/Output from previous day: 11/08 0701 - 11/09 0700 In: 460 [P.O.:360; IV Piggyback:100] Out: 2150 [Urine:2150] Intake/Output from this shift: Total I/O In: -  Out: 575 [Urine:575]  Labs:  Recent Labs  12/10/13 0343 12/11/13 0505  WBC 17.1* 13.5*  HGB 12.1 12.4  PLT 389 423*  CREATININE 0.73 0.99   Estimated Creatinine Clearance: 36.1 mL/min (by C-G formula based on Cr of 0.99). No results for input(s): VANCOTROUGH, VANCOPEAK, VANCORANDOM, GENTTROUGH, GENTPEAK, GENTRANDOM, TOBRATROUGH, TOBRAPEAK, TOBRARND, AMIKACINPEAK, AMIKACINTROU, AMIKACIN in the last 72 hours.    Assessment: 80yo F w/ weakness, recurrent falls, and AMS. Hx dysphagia, underwent EGD for food disimpaction on 11/15/13. Pharmacy dosing cefepime for suspected aspiration pna/HCAP, now consulted to add vancomycin.  11/3 >> Clinda x 1 11/3 >> Cefepime >>  11/5 >> Vancomycin >> 11/7  Tmax: AF WBCs: elevated but improving (13.5k on 11/8) Renal: SCr now WNL, CrCl 36 ml/min (CG)  11/3 blood: NGF 11/3 urine: NGF 11/3 MRSA screen: negative  Today, 11/9 Day #7 cefepime  Goal of Therapy:  Appropriate antibiotic dosing for renal function; eradication of infection  Plan:   Continue Cefepime 1g IV q12h  F/u duration of therapy - per CCM note on 11/7, complete 7 days   Loralee PacasErin Odalys Win, PharmD, BCPS Pager: [phone] 12/12/2013 11:31 AM

## 2013-12-12 NOTE — Plan of Care (Signed)
Problem: Phase I Progression Outcomes Goal: First antibiotic given within 6hrs of admit Outcome: Completed/Met Date Met:  12/12/13

## 2013-12-12 NOTE — Progress Notes (Signed)
Subjective: Was sleeping comfortably on oxygen today, cough improved  Objective: Vital signs in last 24 hours: Temp:  [97.7 F (36.5 C)-97.9 F (36.6 C)] 97.7 F (36.5 C) (11/09 0522) Pulse Rate:  [80-105] 85 (11/09 0522) Resp:  [18] 18 (11/09 0522) BP: (114-130)/(55-67) 123/65 mmHg (11/09 0522) SpO2:  [95 %-99 %] 95 % (11/09 0522) Weight change:    Intake/Output from previous day: 11/08 0701 - 11/09 0700 In: 460 [P.O.:360; IV Piggyback:100] Out: 2150 [Urine:2150]   General appearance: alert, cooperative and no distress Resp: crackles right base Cardio: regular rate and rhythm Extremities: extremities normal, atraumatic, no cyanosis or edema easily awoken and speech normal  Lab Results:  Recent Labs  12/10/13 0343 12/11/13 0505  WBC 17.1* 13.5*  HGB 12.1 12.4  HCT 35.0* 35.4*  PLT 389 423*   BMET  Recent Labs  12/10/13 0343 12/11/13 0505  NA 140 139  K 3.2* 3.4*  CL 101 99  CO2 25 28  GLUCOSE 127* 146*  BUN 13 20  CREATININE 0.73 0.99  CALCIUM 7.9* 8.2*   CMET CMP     Component Value Date/Time   NA 139 12/11/2013 0505   K 3.4* 12/11/2013 0505   CL 99 12/11/2013 0505   CO2 28 12/11/2013 0505   GLUCOSE 146* 12/11/2013 0505   BUN 20 12/11/2013 0505   CREATININE 0.99 12/11/2013 0505   CALCIUM 8.2* 12/11/2013 0505   GFRNONAA 49* 12/11/2013 0505   GFRAA 57* 12/11/2013 0505    CBG (last 3)  No results for input(s): GLUCAP in the last 72 hours.  INR RESULTS:  No results found for: INR, PROTIME   Studies/Results: No results found.  Medications: I have reviewed the patient's current medications.  Assessment/Plan: #1 Pneumonia: improved on antibiotics and will switch to po at time of discharge to a SNF #2 Esophageal stricture: possible EGD with stricture dilatation today #3 Atrial fibrillation: stable with telemetry showing intermittent afib and sinus rhythm, hold on longterm anticoagulation due to high fall risk #4 Gait instability and  physical deconditioning: moderate and will plan on SNF rehabilitation  LOS: 6 days   Latif Nazareno G 12/12/2013, 7:32 AM

## 2013-12-12 NOTE — Progress Notes (Signed)
Physical Therapy Treatment Patient Details Name: Katrina Flynn MRN: [id number] DOB: 02-07-1922 Today's Date: 12/12/2013    History of Present Illness 80 yo female admitted with pna, falls, hypotension. Hx of hydrocephalus, VP shunt, chronic gait instability, osteoporosis. [facility],.     PT Comments    Found pt in bed incont loose stools.  [facility] hygiene [facility]  Very unsteady/shaky.  Pt required + 2 assist to amb limited distance.  Poor balance.  HIGH FALL RISK  Follow Up Recommendations  SNF     Equipment Recommendations       Recommendations for Other Services       Precautions / Restrictions Precautions Precautions: Fall Restrictions Weight Bearing Restrictions: No    Mobility  Bed Mobility Overal bed mobility: Needs Assistance Bed Mobility: Supine to Sit     Supine to sit: Min assist;Mod assist     General bed mobility comments: increased time and extra assist with upper body  Transfers Overall transfer level: Needs assistance Equipment used: 1 person hand held assist Transfers: Sit to/from Stand;Stand Pivot Transfers Sit to Stand: Min assist;Mod assist Stand pivot transfers: Min assist;Mod assist       General transfer comment: [facility] with 50% VC's on proper hand placement and turn completion.  Severe posterior lean and incomplete turns.    Ambulation/Gait Ambulation/Gait assistance: +2 physical assistance;+2 safety/equipment;Mod assist;Max assist Ambulation Distance (Feet): 7 Feet Assistive device: Rolling walker (2 wheeled) Gait Pattern/deviations: Step-to pattern;Trunk flexed;Narrow base of support Gait velocity: decreased   General Gait Details: Very unsteady shaky gait with increased anxiety/fear and assist with RW advancement and correct posterior lean.  Difficulty weight shifting and majority body weight on her heels.  75% VC's to decrease gait speed even more to decrease tremors   Stairs             Wheelchair Mobility    Modified Rankin (Stroke Patients Only)       Balance                                    Cognition                            Exercises      General Comments        Pertinent Vitals/Pain Pain Assessment: No/denies pain    Home Living                      Prior Function            PT Goals (current goals can now be found in the care plan section) Progress towards PT goals: Progressing toward goals    Frequency  Min 3X/week    PT Plan      Co-evaluation             End of Session Equipment Utilized During Treatment: Gait belt Activity Tolerance: Patient limited by fatigue Patient left: in chair;with call bell/phone within reach;with chair alarm set     Time: [id number] PT Time Calculation (min): 33 min  Charges:  $Gait Training: 8-22 mins $Therapeutic Activity: 8-22 mins                    G Codes:      Felecia ShellingLori Louie Meaders  PTA [facility]  Acute  Rehab Pager      [phone]

## 2013-12-12 NOTE — Progress Notes (Signed)
    Progress Note   Subjective  feels okay today   Objective   Vital signs in last 24 hours: Temp:  [97.7 F (36.5 C)-97.9 F (36.6 C)] 97.7 F (36.5 C) (11/09 0522) Pulse Rate:  [80-105] 85 (11/09 0522) Resp:  [18] 18 (11/09 0522) BP: (114-130)/(55-67) 123/65 mmHg (11/09 0522) SpO2:  [95 %-99 %] 95 % (11/09 0522) Last BM Date: 12/10/13 General:    Pleasant white female in NAD Heart:  Regular rate, irregular rhythm Lungs: Respirations even and unlabored,decreased breath sounds in RUL and RLL Abdomen:  Soft, nontender and nondistended. Normal bowel sounds. Extremities:  Without edema. Neurologic:  Alert and oriented,  grossly normal neurologically. Psych:  Cooperative. Normal mood and affect.  Lab Results:  Recent Labs  12/10/13 0343 12/11/13 0505  WBC 17.1* 13.5*  HGB 12.1 12.4  HCT 35.0* 35.4*  PLT 389 423*   BMET  Recent Labs  12/10/13 0343 12/11/13 0505  NA 140 139  K 3.2* 3.4*  CL 101 99  CO2 25 28  GLUCOSE 127* 146*  BUN 13 20  CREATININE 0.73 0.99  CALCIUM 7.9* 8.2*    EGD 11/15/13 ENDOSCOPIC IMPRESSION: 1. There was a short stricture at the gastroesophageal junction 2. There was a moderate amount of residual food seen in the entire esophagus 3. There was LA Class D esophagitis noted 4. EGD was otherwise normal RECOMMENDATIONS: 1. begin Protonix 40 mg twice a day 2. soft diet 3. endoscopy with balloon dilation 2-3 weeks   Assessment / Plan:   211. 80 year old female with esophageal stricture / recent food impaction. Plan was for eventual repeat EGD with an esophageal dilation but now hospitalized with possible aspiration PNA. Plan is for inpatient EGD in next few days  2. Class D esophagitis.  Continue PPI, carafate and reflux precautions.    LOS: 6 days   Willette Clusteraula Guenther  12/12/2013, 9:15 AM   ________________________________________________________________________  [facility] MD note:  I personally examined the patient, reviewed the  data and agree with the assessment and plan described above. Planning on EGD with likely dilation tomorrow AM.  Swallowing problem, stricture likely related to severe GERD and she will need to be on BID PPI indefinitely (best taken 20-30 min prior to BF and dinner meals).   Rob Buntinganiel Jacobs, MD [facility] Pager [phone]

## 2013-12-13 ENCOUNTER — Inpatient Hospital Stay (HOSPITAL_COMMUNITY): Payer: Medicare Other | Admitting: Certified Registered Nurse Anesthetist

## 2013-12-13 ENCOUNTER — Encounter (HOSPITAL_COMMUNITY): Payer: Self-pay | Admitting: Certified Registered Nurse Anesthetist

## 2013-12-13 ENCOUNTER — Encounter (HOSPITAL_COMMUNITY)
Admission: EM | Disposition: A | Discharge status: Skilled Nursing Facility | Payer: Self-pay | Source: Home / Self Care | Attending: Internal Medicine

## 2013-12-13 HISTORY — PX: ESOPHAGOGASTRODUODENOSCOPY: SHX5428

## 2013-12-13 SURGERY — EGD (ESOPHAGOGASTRODUODENOSCOPY)
Anesthesia: Moderate Sedation

## 2013-12-13 SURGERY — EGD (ESOPHAGOGASTRODUODENOSCOPY)
Anesthesia: Monitor Anesthesia Care

## 2013-12-13 MED ORDER — LACTATED RINGERS IV SOLN
INTRAVENOUS | Status: DC | PRN
  Administered 2013-12-13: 10:00:00 via INTRAVENOUS

## 2013-12-13 MED ORDER — ONDANSETRON HCL 4 MG/2ML IJ SOLN
INTRAMUSCULAR | Status: DC | PRN
  Administered 2013-12-13: 4 mg via INTRAVENOUS

## 2013-12-13 MED ORDER — LIDOCAINE HCL (CARDIAC) 20 MG/ML IV SOLN
INTRAVENOUS | Status: AC
  Filled 2013-12-13: qty 5

## 2013-12-13 MED ORDER — LACTATED RINGERS IV SOLN
INTRAVENOUS | Status: DC
Start: 2013-12-13 — End: 2013-12-13

## 2013-12-13 MED ORDER — PROPOFOL INFUSION 10 MG/ML OPTIME
INTRAVENOUS | Status: DC | PRN
  Administered 2013-12-13: 100 ug/kg/min via INTRAVENOUS

## 2013-12-13 MED ORDER — ONDANSETRON HCL 4 MG/2ML IJ SOLN
INTRAMUSCULAR | Status: AC
  Filled 2013-12-13: qty 2

## 2013-12-13 MED ORDER — PROPOFOL 10 MG/ML IV BOLUS
INTRAVENOUS | Status: AC
  Filled 2013-12-13: qty 20

## 2013-12-13 MED ORDER — LIDOCAINE HCL 1 % IJ SOLN
INTRAMUSCULAR | Status: AC
  Filled 2013-12-13: qty 20

## 2013-12-13 MED ORDER — LIDOCAINE HCL (CARDIAC) 20 MG/ML IV SOLN
INTRAVENOUS | Status: DC | PRN
  Administered 2013-12-13: 100 mg via INTRAVENOUS

## 2013-12-13 NOTE — Anesthesia Preprocedure Evaluation (Addendum)
Anesthesia Evaluation  Patient identified by MRN, date of birth, ID band Patient awake    Reviewed: Allergy & Precautions, H&P , NPO status , Patient's Chart, lab work & pertinent test results  History of Anesthesia Complications (+) PONV  Airway Mallampati: I  TM Distance: >3 FB Neck ROM: Full    Dental  (+) Teeth Intact, Dental Advisory Given   Pulmonary pneumonia - (+Aspiration pna from dysphagia),          Cardiovascular hypertension, Pt. on medications + dysrhythmias Atrial Fibrillation + Valvular Problems/Murmurs AS and MR Rhythm:Irregular Rate:Normal  12/09/13: TTE: EF 55-60%. Mod-severe AS, moderate PI, Moderate TR, Mild MR.   Neuro/Psych negative neurological ROS  negative psych ROS   GI/Hepatic negative GI ROS, Neg liver ROS, Dysphagia, esophageal stricture   Endo/Other  negative endocrine ROS  Renal/GU negative Renal ROS     Musculoskeletal  (+) Arthritis -,   Abdominal   Peds  Hematology  (+) anemia ,   Anesthesia Other Findings   Reproductive/Obstetrics                            Anesthesia Physical Anesthesia Plan  ASA: III  Anesthesia Plan: MAC   Post-op Pain Management:    Induction: Intravenous  Airway Management Planned: Natural Airway and Simple Face Mask  Additional Equipment:   Intra-op Plan:   Post-operative Plan:   Informed Consent: I have reviewed the patients History and Physical, chart, labs and discussed the procedure including the risks, benefits and alternatives for the proposed anesthesia with the patient or authorized representative who has indicated his/her understanding and acceptance.   Dental advisory given  Plan Discussed with: CRNA and Surgeon  Anesthesia Plan Comments:         Anesthesia Quick Evaluation

## 2013-12-13 NOTE — Progress Notes (Signed)
SLP Cancellation Note  Patient Details Name: Katrina Flynn MRN: [id number] DOB: [birth year]   Cancelled treatment:       Reason Eval/Treat Not Completed:  (pt for possible EGD today and is currently NPO)   Donavan Burnetamara Amilio Zehnder, MS [facility] SLP [phone]

## 2013-12-13 NOTE — Interval H&P Note (Signed)
History and Physical Interval Note:  12/13/2013 9:52 AM  Katrina Flynn  has presented today for surgery, with the diagnosis of Dysphagia; history of stricture and severe esophagitis  The various methods of treatment have been discussed with the patient and family. After consideration of risks, benefits and other options for treatment, the patient has consented to  Procedure(s): ESOPHAGOGASTRODUODENOSCOPY (EGD) (N/A) as a surgical intervention .  The patient's history has been reviewed, patient examined, no change in status, stable for surgery.  I have reviewed the patient's chart and labs.  Questions were answered to the patient's satisfaction.     Rachael FeeJacobs, Daniel P

## 2013-12-13 NOTE — Op Note (Signed)
[facility] [address]  [place], [id number]   ENDOSCOPY PROCEDURE REPORT  PATIENT: Katrina Flynn, Katrina Flynn  MR#: [id number] BIRTHDATE: 07/09/1922 , 89  yrs. old GENDER: female ENDOSCOPIST: Rachael Feeaniel P Jacobs, MD PROCEDURE DATE:  12/13/2013 PROCEDURE:  EGD w/ balloon dilation ASA CLASS:     Class IV INDICATIONS:  dysphagia, recent EGD with Dr.  Arlyce DiceKaplan found severe ulcerative reflux esopahgitis, was put on twice daily PPI. MEDICATIONS: Monitored anesthesia care TOPICAL ANESTHETIC: none  DESCRIPTION OF PROCEDURE: After the risks benefits and alternatives of the procedure were thoroughly explained, informed consent was obtained.  The Pentax EG-[id number] peds [id number] endoscope was introduced through the mouth and advanced to the second portion of the duodenum , Without limitations.  The instrument was slowly withdrawn as the mucosa was fully examined.  The recently noted severe esophagitis has completely healed.  There was a minor GE junction stricture that looked to be from a small Schatzki's ring.  This was dilated with CRE TTS balloon held inflated to 20mm for one minute.  This resulted in very minor self limited bleed at GE junction.  There was a 4-5cm hiatal hernia with typical resulting tortuous and forshortened esophagus.  The examination was otherwise normal.  Retroflexed views revealed no abnormalities.     The scope was then withdrawn from the patient and the procedure completed. COMPLICATIONS: There were no immediate complications.  ENDOSCOPIC IMPRESSION: The recently noted severe esophagitis has completely healed.  There was a minor GE junction stricture that looked to be from a small Schatzki's ring.  This was dilated with CRE TTS balloon held inflated to 20mm for one minute.  This resulted in very minor self limited bleed at GE junction.  There was a 4-5cm hiatal hernia with typical resulting tortuous and forshortened esophagus.  The examination was otherwise  normal  RECOMMENDATIONS: She should continue twice daily PPI indefintiely.  This is best take 20-30 min prior to BF and dinner meals.  Will advance diet as tolerated.   eSigned:  Rachael Feeaniel P Jacobs, MD 12/13/2013 10:45 AM    CC: Melvia Heapsobert Kaplan, MD

## 2013-12-13 NOTE — Progress Notes (Signed)
Subjective: Breathing is slowly improving and slept well last night.  Objective: Vital signs in last 24 hours: Temp:  [97.6 F (36.4 C)-98.2 F (36.8 C)] 97.8 F (36.6 C) (11/10 0500) Pulse Rate:  [81-91] 84 (11/10 0500) Resp:  [18-20] 20 (11/10 0500) BP: (99-125)/(44-64) 106/53 mmHg (11/10 0500) SpO2:  [95 %-98 %] 98 % (11/10 0500) Weight change:    Intake/Output from previous day: 11/09 0701 - 11/10 0700 In: 100 [IV Piggyback:100] Out: 1425 [Urine:1425]   General appearance: alert, cooperative and no distress Resp: clear to auscultation bilaterally Cardio: regular rate and rhythm GI: soft, non-tender; bowel sounds normal; no masses,  no organomegaly Extremities: extremities normal, atraumatic, no cyanosis or edema  Lab Results:  Recent Labs  12/11/13 0505  WBC 13.5*  HGB 12.4  HCT 35.4*  PLT 423*   BMET  Recent Labs  12/11/13 0505  NA 139  K 3.4*  CL 99  CO2 28  GLUCOSE 146*  BUN 20  CREATININE 0.99  CALCIUM 8.2*   CMET CMP     Component Value Date/Time   NA 139 12/11/2013 0505   K 3.4* 12/11/2013 0505   CL 99 12/11/2013 0505   CO2 28 12/11/2013 0505   GLUCOSE 146* 12/11/2013 0505   BUN 20 12/11/2013 0505   CREATININE 0.99 12/11/2013 0505   CALCIUM 8.2* 12/11/2013 0505   GFRNONAA 49* 12/11/2013 0505   GFRAA 57* 12/11/2013 0505    CBG (last 3)  No results for input(s): GLUCAP in the last 72 hours.  INR RESULTS:  No results found for: INR, PROTIME   Studies/Results: No results found.  Medications: I have reviewed the patient's current medications.  Assessment/Plan: #1 Pneumonia: stable on meds and will switch to po antibiotics upon discharge to a SNF (likely tomorrow) #2 GERD: stable with plans for EGD and possible esophageal dilatation today  LOS: 7 days   Ileen Kahre G 12/13/2013, 7:24 AM

## 2013-12-13 NOTE — Anesthesia Postprocedure Evaluation (Signed)
  Anesthesia Post-op Note  Patient: Katrina Flynn  Procedure(s) Performed: Procedure(s): ESOPHAGOGASTRODUODENOSCOPY (EGD) (N/A)  Patient Location: PACU  Anesthesia Type:MAC  Level of Consciousness: awake, alert  and oriented  Airway and Oxygen Therapy: Patient Spontanous Breathing  Post-op Pain: none  Post-op Assessment: Post-op Vital signs reviewed  Post-op Vital Signs: Reviewed  Last Vitals:  Filed Vitals:   12/13/13 1207  BP: 119/74  Pulse: 67  Temp: 36.7 C  Resp: 19    Complications: No apparent anesthesia complications

## 2013-12-13 NOTE — Transfer of Care (Signed)
Immediate Anesthesia Transfer of Care Note  Patient: Katrina Flynn  Procedure(s) Performed: Procedure(s) (LRB): ESOPHAGOGASTRODUODENOSCOPY (EGD) (N/A)  Patient Location: PACU  Anesthesia Type: MAC  Level of Consciousness: sedated, patient cooperative and responds to stimulation  Airway & Oxygen Therapy: Patient Spontanous Breathing and Patient connected to face mask oxgen  Post-op Assessment: Report given to PACU RN and Post -op Vital signs reviewed and stable  Post vital signs: Reviewed and stable  Complications: No apparent anesthesia complications

## 2013-12-13 NOTE — Progress Notes (Signed)
CSW continues to follow for discharge planning - [facility]. Anticipating discharge tomorrow.   Lincoln MaxinKelly Evalynn Hankins, LCSW [facility] Clinical Social Worker cell #: [phone]

## 2013-12-13 NOTE — H&P (View-Only) (Signed)
    Progress Note   Subjective  feels okay today   Objective   Vital signs in last 24 hours: Temp:  [97.7 F (36.5 C)-97.9 F (36.6 C)] 97.7 F (36.5 C) (11/09 0522) Pulse Rate:  [80-105] 85 (11/09 0522) Resp:  [18] 18 (11/09 0522) BP: (114-130)/(55-67) 123/65 mmHg (11/09 0522) SpO2:  [95 %-99 %] 95 % (11/09 0522) Last BM Date: 12/10/13 General:    Pleasant white female in NAD Heart:  Regular rate, irregular rhythm Lungs: Respirations even and unlabored,decreased breath sounds in RUL and RLL Abdomen:  Soft, nontender and nondistended. Normal bowel sounds. Extremities:  Without edema. Neurologic:  Alert and oriented,  grossly normal neurologically. Psych:  Cooperative. Normal mood and affect.  Lab Results:  Recent Labs  12/10/13 0343 12/11/13 0505  WBC 17.1* 13.5*  HGB 12.1 12.4  HCT 35.0* 35.4*  PLT 389 423*   BMET  Recent Labs  12/10/13 0343 12/11/13 0505  NA 140 139  K 3.2* 3.4*  CL 101 99  CO2 25 28  GLUCOSE 127* 146*  BUN 13 20  CREATININE 0.73 0.99  CALCIUM 7.9* 8.2*    EGD 11/15/13 ENDOSCOPIC IMPRESSION: 1. There was a short stricture at the gastroesophageal junction 2. There was a moderate amount of residual food seen in the entire esophagus 3. There was LA Class D esophagitis noted 4. EGD was otherwise normal RECOMMENDATIONS: 1. begin Protonix 40 mg twice a day 2. soft diet 3. endoscopy with balloon dilation 2-3 weeks   Assessment / Plan:   1. 80 year old female with esophageal stricture / recent food impaction. Plan was for eventual repeat EGD with an esophageal dilation but now hospitalized with possible aspiration PNA. Plan is for inpatient EGD in next few days  2. Class D esophagitis.  Continue PPI, carafate and reflux precautions.    LOS: 6 days   Paula Guenther  12/12/2013, 9:15 AM   ________________________________________________________________________  [facility] MD note:  I personally examined the patient, reviewed the  data and agree with the assessment and plan described above. Planning on EGD with likely dilation tomorrow AM.  Swallowing problem, stricture likely related to severe GERD and she will need to be on BID PPI indefinitely (best taken 20-30 min prior to BF and dinner meals).   Roslyn Else, MD [facility] Pager [phone]  

## 2013-12-14 ENCOUNTER — Encounter (HOSPITAL_COMMUNITY): Payer: Self-pay | Admitting: Gastroenterology

## 2013-12-14 DIAGNOSIS — I350 Nonrheumatic aortic (valve) stenosis: Secondary | ICD-10-CM

## 2013-12-14 DIAGNOSIS — N189 Chronic kidney disease, unspecified: Secondary | ICD-10-CM | POA: Diagnosis present

## 2013-12-14 DIAGNOSIS — I4891 Unspecified atrial fibrillation: Secondary | ICD-10-CM | POA: Diagnosis not present

## 2013-12-14 DIAGNOSIS — E871 Hypo-osmolality and hyponatremia: Secondary | ICD-10-CM | POA: Diagnosis not present

## 2013-12-14 DIAGNOSIS — I272 Pulmonary hypertension, unspecified: Secondary | ICD-10-CM | POA: Diagnosis present

## 2013-12-14 MED ORDER — SPIRONOLACTONE 25 MG PO TABS: 25.0000 mg | ORAL_TABLET | Freq: Every day | ORAL | Status: AC

## 2013-12-14 MED ORDER — DILTIAZEM HCL 60 MG PO TABS: 60.0000 mg | ORAL_TABLET | Freq: Two times a day (BID) | ORAL | Status: AC

## 2013-12-14 MED ORDER — MOXIFLOXACIN HCL 400 MG PO TABS: 400.0000 mg | ORAL_TABLET | Freq: Every day | ORAL | Status: DC

## 2013-12-14 MED ORDER — FUROSEMIDE 40 MG PO TABS: 40.0000 mg | ORAL_TABLET | Freq: Every day | ORAL | Status: AC

## 2013-12-14 NOTE — Progress Notes (Signed)
[facility]. Patient & son, Katrina Flynn aware. [employer] insurance authorization obtained. Discharge packet given to RN, Katrina Flynn. [facility] called for transport.   Clinical Social Work Department CLINICAL SOCIAL WORK PLACEMENT NOTE 12/14/2013  Patient:  Katrina Flynn,Katrina Flynn  Account Number:  [id number] Admit date:  12/06/2013  Clinical Social Worker:  Orpah GreekKELLY FOLEY, LCSWA  Date/time:  12/08/2013 12:31 PM  Clinical Social Work is seeking post-discharge placement for this patient at the following level of care:   SKILLED NURSING   (*CSW will update this form in Epic as items are completed)   12/08/2013  Patient/family provided with [facility] Department of Clinical Social Work's list of facilities offering this level of care within the geographic area requested by the patient (or if unable, by the patient's family).  12/08/2013  Patient/family informed of their freedom to choose among providers that offer the needed level of care, that participate in Medicare, Medicaid or managed care program needed by the patient, have an available bed and are willing to accept the patient.  12/08/2013  Patient/family informed of [facility]' ownership interest in [facility], as well as of the fact that they are under no obligation to receive care at this facility.  PASARR submitted to EDS on 12/08/2013 PASARR number received on 12/08/2013  FL2 transmitted to all facilities in geographic area requested by pt/family on  12/08/2013 FL2 transmitted to all facilities within larger geographic area on   Patient informed that his/her managed care company has contracts with or will negotiate with  certain facilities, including the following:   [facility]     Patient/family informed of bed offers received:  12/08/2013 [facility] Physician recommends and patient chooses bed at    [facility] on  12/14/2013 Patient  to be transferred to facility by [facility] Patient and family notified of transfer on 12/14/2013 Name of family member notified:  patient's son, Katrina Flynn via phone  The following physician request were entered in Epic:   Additional Comments:   Lincoln MaxinKelly Xara Paulding, LCSW [facility] Clinical Social Worker cell #: [phone]

## 2013-12-14 NOTE — Progress Notes (Signed)
Occupational Therapy Treatment Patient Details Name: Katrina Flynn MRN: [id number] DOB: May 29, 1922 Today's Date: 12/14/2013    History of present illness 80 yo female admitted with pna, falls, hypotension. Hx of hydrocephalus, VP shunt, chronic gait instability, osteoporosis. [facility] is [facility],.    OT comments  Pt will needs SNF for rehab  Follow Up Recommendations       Equipment Recommendations  None recommended by OT       Precautions / Restrictions Restrictions Weight Bearing Restrictions: No       Mobility Bed Mobility Overal bed mobility: Needs Assistance Bed Mobility: Supine to Sit     Supine to sit: Min assist        Transfers Overall transfer level: Needs assistance Equipment used: 1 person hand held assist Transfers: Sit to/from Stand Sit to Stand: Mod assist Stand pivot transfers: Mod assist                ADL       Grooming: Brushing hair;Sitting;Minimal assistance                   Toilet Transfer: Moderate assistance Toilet Transfer Details (indicate cue type and reason): sit to stand from EOB.Pt with posterior lean Toileting- Clothing Manipulation and Hygiene: Sit to/from stand;Total assistance         General ADL Comments: pt sat EOB for approx 8 min while grooming and taking meds      Vision                            Cognition   Behavior During Therapy: WFL for tasks assessed/performed Overall Cognitive Status: No family/caregiver present to determine baseline cognitive functioning                               General Comments  pt very agreeable to sit EOB and participate with OT    Pertinent Vitals/ Pain       Pain Assessment: No/denies pain         Progress Toward Goals  OT Goals(current goals can now be found in the care plan section)  Progress towards OT goals: Progressing toward goals  Acute Rehab OT Goals Patient Stated Goal: see my husband - [facility] is [facility]  Plan Discharge  plan remains appropriate       End of Session     Activity Tolerance Patient tolerated treatment well   Patient Left in bed;with call bell/phone within reach;with nursing/sitter in room   Nurse Communication Mobility status        Time: [id number] OT Time Calculation (min): 10 min  Charges: OT General Charges $OT Visit: 1 Procedure OT Treatments $Self Care/Home Management : 8-22 mins  Fotios Amos D 12/14/2013, 8:52 AM

## 2013-12-14 NOTE — Progress Notes (Signed)
Attempted to call [facility] to [facility].  Placed on hold for over five minutes, no one available to take report.

## 2013-12-14 NOTE — Discharge Summary (Signed)
Physician Discharge Summary  Patient ID: Katrina Flynn MRN: [id number] DOB/AGE: 78-Nov-1924 78 y.o.  Admit date: 12/06/2013 Discharge date: 12/14/2013   Discharge Diagnoses:  Principal Problem:   Pneumonia Active Problems:   Fall   Esophageal stricture   Protein-calorie malnutrition, severe   Atrial fibrillation   Aortic stenosis, severe   Pulmonary hypertension   Protein-calorie malnutrition   Generalized weakness   Acute renal failure   Hypotension   Aspiration pneumonia   Chronic kidney disease   Hyponatremia   Discharged Condition: good  Hospital Course: Katrina Flynn is an 80 year old white female with a history of hydrocephalus status post VP shunt, chronic gait instability who presented to the emergency department with increased weakness and fall 2. She's had a progressive decline in her functional status over the past several weeks. This started about 3-4 weeks ago when she choked while eating dinner with possible aspiration. She underwent an EGD on 10/13 that showed an esophageal stricture with retained food extraction- started on soft diet with plans to dilate the esophagus soon. Her son reports poor po intake since that time. Had some blood tinged sputum which has resolved. No cough. She has had a progressive weakness prompting them to start PT at [facility] last week. Despite this, she continued to become increasingly weak and fell twice this morning. Her son also felt like she seemed more lethargic with increased confusion, issues with her balance, and possible slurred speech. No focal weakness but was concerned about the possibility of a stroke so he brought her to the emergency department. In the emergency department her head CT shows no acute abnormalities or intracranial hemorrhage. Additional workup revealed an elevated white blood count,probable right lower lobe pneumonia on chest x-ray. In the emergency department her blood pressure dropped to 89/43. Due to  hypotension she was admitted to a stepdown unit for treatment. She was given a dose of Clindamycin andd cefepime in the emergency department.  She was admitted to an ICU step down bed and initially did well, then developed atrial fibrillation with worsening dyspnea that required oxygen treatment, diuretics, and IV cardizem. She had an echocardiogram that showed normal LV systolic function with severe aortic stenosis, mild MR, moderate TR, and pulmonary HTN with estimated PAPs 57. She also had a barium swallow test and an EGD with dilatation of a distal esophageal stricture. A CT scan of the head showed an old left frontal /parietal stroke and no acute findings. She is quite weak and needs continued treatment of pneumonia and CHF from diastolic dysfunction, and gait training. On the day of discharge she slept well with no dyspnea on nasal cannula oxygen. She is to have a soft diet and gradually progress to a regular diet. She should also have a cardiologist consult done as an outpatient. There were no  Complications associated with her hospitalization.   Consults: pulmonary/intensive care and GI  Significant Diagnostic Studies:  No results found.  Labs: Lab Results  Component Value Date   WBC 13.5* 12/11/2013   HGB 12.4 12/11/2013   HCT 35.4* 12/11/2013   MCV 91.0 12/11/2013   PLT 423* 12/11/2013     Recent Labs Lab 12/11/13 0505  NA 139  K 3.4*  CL 99  CO2 28  BUN 20  CREATININE 0.99  CALCIUM 8.2*  GLUCOSE 146*      No results found for: INR, PROTIME   Recent Results (from the past 240 hour(s))  Urine culture     Status: None  Collection Time: 12/06/13 11:09 AM  Result Value Ref Range Status   Specimen Description URINE, CATHETERIZED  Final   Special Requests NONE  Final   Culture  Setup Time   Final    12/06/2013 21:56 Performed at [facility]    Colony Count NO GROWTH Performed at [facility]   Final   Culture NO GROWTH Performed at [facility]   Final   Report Status 12/07/2013 FINAL  Final  Culture, blood (routine x 2)     Status: None   Collection Time: 12/06/13  1:12 PM  Result Value Ref Range Status   Specimen Description BLOOD RIGHT FOREARM  Final   Special Requests BOTTLES DRAWN AEROBIC AND ANAEROBIC 4ML  Final   Culture  Setup Time   Final    12/06/2013 18:13 Performed at [facility]    Culture   Final    NO GROWTH 5 DAYS Performed at [facility]    Report Status 12/12/2013 FINAL  Final  Culture, blood (routine x 2)     Status: None   Collection Time: 12/06/13  1:15 PM  Result Value Ref Range Status   Specimen Description BLOOD RIGHT ANTECUBITAL  Final   Special Requests BOTTLES DRAWN AEROBIC AND ANAEROBIC 4ML  Final   Culture  Setup Time   Final    12/06/2013 18:15 Performed at [facility]    Culture   Final    NO GROWTH 5 DAYS Performed at [facility]    Report Status 12/12/2013 FINAL  Final  MRSA PCR Screening     Status: None   Collection Time: 12/06/13  2:37 PM  Result Value Ref Range Status   MRSA by PCR NEGATIVE NEGATIVE Final    Comment:        The GeneXpert MRSA Assay (FDA approved for NASAL specimens only), is one component of a comprehensive MRSA colonization surveillance program. It is not intended to diagnose MRSA infection nor to guide or monitor treatment for MRSA infections.       Discharge Exam: Blood pressure 103/60, pulse 81, temperature 97.7 F (36.5 C), temperature source Oral, resp. rate 20, height 5\' 6"  (1.676 m), weight 64.9 kg (143 lb 1.3 oz), SpO2 97 %.  Physical Exam: In general she is an elderly caucasian woman in NAD while lying supine in bed on nasal cannula oxygen. HEENT exam was normal, neck was without JVD or bruit, chest had right basilar crackles, heart had a slightly irregular rhythm with a 2/6 SEM, abdomen was benign, extremities were without edema, she was alert and answered questions appropriately and could move  all  Extremities well.   Disposition:  She will be transferred to a SNF today for rehabilitation. When she completes rehab she can schedule a followup visit with Dr Jarome Matinaniel Darius Fillingim by calling [phone]. Note that the process of discharge took 35 minutes.  Discharge Instructions    Call MD for:    Complete by:  As directed   Call for fever, chills, worsening difficulty breathing, or other concerning symptoms     Diet - low sodium heart healthy    Complete by:  As directed      Discharge instructions    Complete by:  As directed   She will go to a skilled nursing facility for rehabilitation of physical deconditioning and gait instability. Body weights and serum electrolytes should be monitored closely. She should be given a nutrition supplement such as medpass to improve  nutrition status     Increase activity slowly    Complete by:  As directed             Medication List    STOP taking these medications        amLODipine 2.5 MG tablet  Commonly known as:  NORVASC     CALCIUM PO     Fish Oil Oil      TAKE these medications        ALPRAZolam 0.5 MG tablet  Commonly known as:  XANAX  Take 0.5 mg by mouth at bedtime as needed for anxiety.     atorvastatin 10 MG tablet  Commonly known as:  LIPITOR  Take 10 mg by mouth every morning.     diltiazem 60 MG tablet  Commonly known as:  CARDIZEM  Take 1 tablet (60 mg total) by mouth 2 (two) times daily.     furosemide 40 MG tablet  Commonly known as:  LASIX  Take 1 tablet (40 mg total) by mouth daily.     ICAPS MV PO  Take 1 tablet by mouth every morning.     losartan 100 MG tablet  Commonly known as:  COZAAR  Take 100 mg by mouth every morning.     moxifloxacin 400 MG tablet  Commonly known as:  AVELOX  Take 1 tablet (400 mg total) by mouth daily at 8 pm.     multivitamin with minerals Tabs tablet  Take 1 tablet by mouth every morning.     pantoprazole 40 MG tablet  Commonly known as:  PROTONIX  Take 40 mg by mouth  2 (two) times daily.     spironolactone 25 MG tablet  Commonly known as:  ALDACTONE  Take 1 tablet (25 mg total) by mouth daily.         Signed: Garlan FillersPATERSON,Shamina Etheridge G 12/14/2013, 8:20 AM

## 2013-12-14 NOTE — Plan of Care (Signed)
Problem: Phase I Progression Outcomes Goal: Hemodynamically stable Outcome: Completed/Met Date Met:  12/14/13  Problem: Phase II Progression Outcomes Goal: Pain controlled Outcome: Completed/Met Date Met:  12/14/13 Goal: Progress activity as tolerated unless otherwise ordered Outcome: Progressing Goal: Discharge plan established Outcome: Completed/Met Date Met:  12/14/13 Goal: Tolerating diet Outcome: Completed/Met Date Met:  12/14/13

## 2013-12-15 ENCOUNTER — Encounter: Payer: Self-pay | Admitting: Adult Health

## 2013-12-15 ENCOUNTER — Other Ambulatory Visit: Payer: Self-pay

## 2013-12-15 ENCOUNTER — Non-Acute Institutional Stay (SKILLED_NURSING_FACILITY): Payer: Medicare Other | Admitting: Adult Health

## 2013-12-15 DIAGNOSIS — I270 Primary pulmonary hypertension: Secondary | ICD-10-CM

## 2013-12-15 DIAGNOSIS — I5030 Unspecified diastolic (congestive) heart failure: Secondary | ICD-10-CM

## 2013-12-15 DIAGNOSIS — E785 Hyperlipidemia, unspecified: Secondary | ICD-10-CM

## 2013-12-15 DIAGNOSIS — J690 Pneumonitis due to inhalation of food and vomit: Secondary | ICD-10-CM

## 2013-12-15 DIAGNOSIS — R531 Weakness: Secondary | ICD-10-CM

## 2013-12-15 DIAGNOSIS — I4891 Unspecified atrial fibrillation: Secondary | ICD-10-CM

## 2013-12-15 DIAGNOSIS — I272 Pulmonary hypertension, unspecified: Secondary | ICD-10-CM

## 2013-12-15 DIAGNOSIS — K222 Esophageal obstruction: Secondary | ICD-10-CM

## 2013-12-15 NOTE — Progress Notes (Signed)
Patient ID: Katrina Flynn, female   DOB: Aug 14, 1922, 80 y.o.   MRN: [id number]   12/15/2013  Facility:  Nursing Home Location:  [facility] Room Number: 507-P LEVEL OF CARE:  SNF (31)   Chief Complaint  Patient presents with  . Hospitalization Follow-up    Aspiration Pneumonia, Generalized weakness, Hyperlipidemia, Pulmonary Hypertension, Esophageal stricture and Diastolic CHF    HISTORY OF PRESENT ILLNESS:  This is an 80 year old female who has been admitted to [facility] on 12/14/13 [facility] with Aspiration pneumonia and Generalized weakness. She has been admitted for a short-term rehabilitation.  REASSESSMENT OF ONGOING PROBLEMS:  ESOPHAGEAL STRICTURE:  She had a possible aspiration while eating dinner. An EGD done 10/13 that showed esophageal stricture with retained food extraction. She is currently on Protonix.  CHF:The patient does not relate significant weight changes, denies sob, DOE, orthopnea, PNDs, pedal edema, palpitations or chest pain.  CHF remains stable.  No complications form the medications being used.  ATRIAL FIBRILLATION: the patients atrial fibrillation remains stable.  The patient denies DOE, tachycardia, orthopnea, transient neurological sx, pedal edema, palpitations, & PNDs.  No complications noted from the medications currently being used.   PAST MEDICAL HISTORY:  Past Medical History  Diagnosis Date  . Hypertension   . Hyperlipidemia   . Hydrocephalus 1990's  . Complication of anesthesia   . PONV (postoperative nausea and vomiting)   . Arthritis     hands    CURRENT MEDICATIONS: Reviewed per MAR/see medication list  No Known Allergies   REVIEW OF SYSTEMS:  GENERAL: no change in appetite, no fatigue, no weight changes, no fever, chills or weakness RESPIRATORY: no cough, SOB, DOE, wheezing, hemoptysis CARDIAC: no chest pain, edema or palpitations GI: no abdominal pain, diarrhea, constipation, heart  burn, nausea or vomiting  PHYSICAL EXAMINATION  GENERAL: no acute distress, normal body habitus EYES: conjunctivae normal, sclerae normal, normal eye lids NECK: supple, trachea midline, no neck masses, no thyroid tenderness, no thyromegaly LYMPHATICS: no LAN in the neck, no supraclavicular LAN RESPIRATORY: breathing is even & unlabored, BS CTAB CARDIAC: irregularly irregular, no murmur,no extra heart sounds, no edema GI: abdomen soft, normal BS, no masses, no tenderness, no hepatomegaly, no splenomegaly EXTREMITIES: able to move all 4 extremities PSYCHIATRIC: the patient is alert & oriented to person, affect & behavior appropriate  LABS/RADIOLOGY: Labs reviewed: Basic Metabolic Panel:  Recent Labs  40/98/1109/07/18 0510 12/10/13 0343 12/11/13 0505  NA 139 140 139  K 3.7 3.2* 3.4*  CL 103 101 99  CO2 20 25 28   GLUCOSE 247* 127* 146*  BUN 15 13 20   CREATININE 0.79 0.73 0.99  CALCIUM 8.2* 7.9* 8.2*   CBC:  Recent Labs  12/06/13 1110  12/09/13 0510 12/10/13 0343 12/11/13 0505  WBC 19.8*  < > 18.8* 17.1* 13.5*  NEUTROABS 17.0*  --   --   --   --   HGB 11.4*  < > 11.8* 12.1 12.4  HCT 33.5*  < > 35.1* 35.0* 35.4*  MCV 92.5  < > 92.9 90.2 91.0  PLT 310  < > 389 389 423*  < > = values in this interval not displayed.  Dg Chest 2 View  12/08/2013   CLINICAL DATA:  Productive cough, nausea and vomiting, and recent pneumonia.  EXAM: CHEST  2 VIEW  COMPARISON:  12/06/2013  FINDINGS: The cardiac silhouette remains enlarged. The patient has taken a shallower inspiration than on the prior study. Right lower  lobe airspace opacity has increased. Small right pleural effusion has also increased in size. Streaky retrocardiac opacity is present in the left lower lobe. No definite left pleural effusion or pneumothorax is identified. VP shunt catheter tubing is noted coursing to the abdomen. Aortic calcification is noted. Grade 1/2 anterolisthesis is again seen near the cervicothoracic junction.   IMPRESSION: 1. Increasing right lower lobe consolidation concerning for worsening pneumonia. Mildly increased size of small right pleural effusion. Continued radiographic follow-up recommended. 2. Subsegmental atelectasis versus infiltrate in the left lower lobe.   Electronically Signed   By: Sebastian AcheAllen  Grady   On: 12/08/2013 09:42   Dg Chest 2 View  12/06/2013   CLINICAL DATA:  Altered mental status. Recent fall with slurred speech.  EXAM: CHEST  2 VIEW  COMPARISON:  One-view chest 11/14/2013  FINDINGS: The heart size is normal. Lung volumes are low. A superior segment right lower lobe pneumonia is evident. There is mild diffuse edema. New small right pleural effusion is evident. Atherosclerotic calcifications noted in the aortic arch. Shunt tubing is evident on the patient's known ventriculostomy.  IMPRESSION: 1. New superior segment right lower lobe pneumonia. 2. Mild interstitial edema.   Electronically Signed   By: Gennette Pachris  Mattern M.D.   On: 12/06/2013 13:02   Ct Head Wo Contrast  12/06/2013   CLINICAL DATA:  80 year old female with 2 falls this morning. Confusion. Hypertension. Hydrocephalus. Initial encounter.  EXAM: CT HEAD WITHOUT CONTRAST  TECHNIQUE: Contiguous axial images were obtained from the base of the skull through the vertex without intravenous contrast.  COMPARISON:  04/14/2013.  FINDINGS: Post left occipital craniectomy. Postoperative cerebral spinal fluid collection at the craniectomy site appears similar to the prior exam. Shunt catheter traverses partially through this collection.  Left parietal shunt catheter extends into left lateral ventricle impressing upon the septum unchanged in position. Right frontal shunt catheter with the tip at the level of the frontal horn unchanged in position. No development of hydrocephalus or slit like ventricles.  No skull fracture is detected.  Extra-axial collection has an appearance more suggestive of a dural thickening rather than result of recent fall  and is without change from the prior examination. Overall, no acute intracranial hemorrhage detected.  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  No CT evidence of large acute infarct.  No intracranial mass lesion noted on this unenhanced exam.  IMPRESSION: No acute intracranial abnormality noted.  Post left occipital craniectomy. Postoperative cerebral spinal fluid collection at the craniectomy site appears similar to the prior exam. Shunt catheter traverses partially through this collection.  Left parietal shunt catheter extends into left lateral ventricle impressing upon the septum unchanged in position. Right frontal shunt catheter with the tip at the level of the frontal horn unchanged in position. No development of hydrocephalus or slit like ventricles.  No skull fracture is detected.  Extra-axial collection has an appearance more suggestive of a dural thickening rather than result of recent fall and is without change from the prior examination. Overall, no acute intracranial hemorrhage detected.  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  No CT evidence of large acute infarct.   Electronically Signed   By: Bridgett LarssonSteve  Olson M.D.   On: 12/06/2013 11:56   Dg Chest Port 1 View  12/09/2013   CLINICAL DATA:  Increasing shortness of breath this morning. Difficulty breathing.  EXAM: PORTABLE CHEST - 1 VIEW  COMPARISON:  12/08/2013  FINDINGS: Mild cardiac  enlargement and central pulmonary vascular congestion. Right perihilar infiltration consistent with edema or pneumonia. This appears to be increasing since previous study. Small right pleural effusion is less prominent. This may be due to differences in patient positioning. No pneumothorax.  IMPRESSION: Cardiac enlargement with pulmonary vascular congestion. Increasing right perihilar infiltration or edema. Small right pleural effusion.   Electronically Signed   By:  Burman Nieves M.D.   On: 12/09/2013 06:19    ASSESSMENT/PLAN:  Aspiration pneumonia - continue Avelox for 5 more days Generalized weakness - for rehabilitation Hyperlipidemia - continue Lipitor 10 mg by mouth daily Pulmonary hypertension - continue Cozaar 100 mg by mouth every morning Atrial fibrillation - rate controlled; continue Cardizem 60 mg by mouth twice a day; follow-up with cardiologist Esophageal stricture - recent EGD with extraction; planning for esophageal dilatation; continue Protonix Diastolic CHF - stable; continue Lasix and Aldactone    Spent 50 minutes in patient care.     [facility], NP [employer] [phone]

## 2013-12-16 ENCOUNTER — Non-Acute Institutional Stay (SKILLED_NURSING_FACILITY): Payer: Medicare Other | Admitting: Internal Medicine

## 2013-12-16 ENCOUNTER — Encounter: Payer: Self-pay | Admitting: Internal Medicine

## 2013-12-16 DIAGNOSIS — I509 Heart failure, unspecified: Secondary | ICD-10-CM

## 2013-12-16 DIAGNOSIS — E785 Hyperlipidemia, unspecified: Secondary | ICD-10-CM

## 2013-12-16 DIAGNOSIS — J690 Pneumonitis due to inhalation of food and vomit: Secondary | ICD-10-CM

## 2013-12-16 DIAGNOSIS — I4891 Unspecified atrial fibrillation: Secondary | ICD-10-CM

## 2013-12-16 DIAGNOSIS — R531 Weakness: Secondary | ICD-10-CM

## 2013-12-16 DIAGNOSIS — K222 Esophageal obstruction: Secondary | ICD-10-CM

## 2013-12-16 NOTE — Progress Notes (Signed)
Patient ID: Katrina Flynn, female   DOB: 12-Oct-1922, 80 y.o.   MRN: [id number]     [facility]  PCP: No primary care provider on file.  Code Status: DNR  No Known Allergies  Chief Complaint  Patient presents with  . New Admit To SNF     HPI:  80 y/o female pt is here for STR post hospital admission from 12/06/13-12/14/13 with a fall and generalized weakness. She was noted to be hypotensive with leukocytosis and had pneumonia seen on cxr. She then went into afib. She was put on o2, diuretics and iv cardizem. Her echocardiogram  showed normal LV systolic function with severe aortic stenosis, mild MR, moderate TR, and pulmonary HTN with estimated PAPs 57. She had a barium swallow test and an EGD with dilatation of a distal esophageal stricture. Goal is for her to return [facility]. She is seen in her room today. She feels weak and tired. She is working with therapy team.   Review of Systems:  Constitutional: Negative for fever, chills, diaphoresis.  HENT: Negative for congestion Respiratory: positive for cough. Negative for shortness of breath and wheezing.   Cardiovascular: Negative for chest pain, palpitations, leg swelling.  Gastrointestinal: Negative for heartburn, nausea, vomiting, abdominal pain. Had bowel movement this am Genitourinary: Negative for dysuria Musculoskeletal: Negative for back pain, falls Skin: Negative for itching, rash.  Neurological: Negative for dizziness,headaches.  Psychiatric/Behavioral: Negative for depression  Past Medical History  Diagnosis Date  . Hypertension   . Hyperlipidemia   . Hydrocephalus 1990's  . Complication of anesthesia   . PONV (postoperative nausea and vomiting)   . Arthritis     hands   Past Surgical History  Procedure Laterality Date  . Csf shunt    . Fracture surgery      lt knee, rt elbow  . Esophagogastroduodenoscopy N/A 11/15/2013    Procedure: ESOPHAGOGASTRODUODENOSCOPY  (EGD);  Surgeon: Louis Meckelobert D Kaplan, MD;  [facility];  Service: Endoscopy;  Laterality: N/A;  . Esophagogastroduodenoscopy N/A 12/13/2013    Procedure: ESOPHAGOGASTRODUODENOSCOPY (EGD);  Surgeon: Rachael Feeaniel P Jacobs, MD;  Location: [facility];  Service: Endoscopy;  Laterality: N/A;   Social History:   reports that she has never smoked. She has never used smokeless tobacco. She reports that she does not drink alcohol or use illicit drugs.  History reviewed. No pertinent family history.  Medications: Patient's Medications  New Prescriptions   No medications on file  Previous Medications   ALPRAZOLAM (XANAX) 0.5 MG TABLET    Take 0.5 mg by mouth at bedtime as needed for anxiety.   ATORVASTATIN (LIPITOR) 10 MG TABLET    Take 10 mg by mouth every morning.    DILTIAZEM (CARDIZEM) 60 MG TABLET    Take 1 tablet (60 mg total) by mouth 2 (two) times daily.   FUROSEMIDE (LASIX) 40 MG TABLET    Take 1 tablet (40 mg total) by mouth daily.   LOSARTAN (COZAAR) 100 MG TABLET    Take 100 mg by mouth every morning.    MOXIFLOXACIN (AVELOX) 400 MG TABLET    Take 1 tablet (400 mg total) by mouth daily at 8 pm.   MULTIPLE VITAMIN (MULTIVITAMIN WITH MINERALS) TABS TABLET    Take 1 tablet by mouth every morning.    MULTIPLE VITAMINS-MINERALS (ICAPS MV PO)    Take 1 tablet by mouth every morning.    PANTOPRAZOLE (PROTONIX) 40 MG TABLET    Take 40 mg  by mouth 2 (two) times daily.   SPIRONOLACTONE (ALDACTONE) 25 MG TABLET    Take 1 tablet (25 mg total) by mouth daily.  Modified Medications   No medications on file  Discontinued Medications   No medications on file     Physical Exam Filed Vitals:   12/16/13 1418  BP: 113/72  Pulse: 70  Temp: 96.5 F (35.8 C)  Resp: 15  Weight: 132 lb (59.875 kg)  SpO2: 91%    General- elderly female in no acute distress Head- atraumatic, normocephalic Eyes- no pallor, no icterus, no discharge Neck- no cervical lymphadenopathy Cardiovascular- irregular  heart rate, normal s1,s2, no murmurs Respiratory- bilateral decreased air entry, no wheeze, no rhonchi, no crackles, no use of accessory muscles Abdomen- bowel sounds present, soft, non tender Musculoskeletal- able to move all 4 extremities, generalized weakness, no leg edema Neurological- no focal deficit Skin- warm and dry, easy bruising Psychiatry- alert and oriented to person, place and time, normal mood and affect    Labs reviewed: Basic Metabolic Panel:  Recent Labs  16/11/9609/06/15 0510 12/10/13 0343 12/11/13 0505  NA 139 140 139  K 3.7 3.2* 3.4*  CL 103 101 99  CO2 20 25 28   GLUCOSE 247* 127* 146*  BUN 15 13 20   CREATININE 0.79 0.73 0.99  CALCIUM 8.2* 7.9* 8.2*   Liver Function Tests: No results for input(s): AST, ALT, ALKPHOS, BILITOT, PROT, ALBUMIN in the last 8760 hours. No results for input(s): LIPASE, AMYLASE in the last 8760 hours. No results for input(s): AMMONIA in the last 8760 hours. CBC:  Recent Labs  12/06/13 1110  12/09/13 0510 12/10/13 0343 12/11/13 0505  WBC 19.8*  < > 18.8* 17.1* 13.5*  NEUTROABS 17.0*  --   --   --   --   HGB 11.4*  < > 11.8* 12.1 12.4  HCT 33.5*  < > 35.1* 35.0* 35.4*  MCV 92.5  < > 92.9 90.2 91.0  PLT 310  < > 389 389 423*  < > = values in this interval not displayed.  Assessment/Plan  Generalized weakness Will have her work with physical therapy and occupational therapy team to help with gait training and muscle strengthening exercises.fall precautions. Skin care. Encourage to be out of bed.   Aspiration pneumonia Complete course of Avelox, aspiration precautions, monitor cbc  Esophageal stricture S/p dilatation. Continue protonix   afib Rate controlled. Continue cardizem  chf exacerbation Stable now. Monitor weight. Continue diuresis with lasix and aldactone, continue cozaar  HTN Continue cozaar, monitor bp  Hyperlipidemia  continue Lipitor 10 mg daily  Family/ staff Communication: reviewed care plan with  patient and nursing supervisor   Goals of care: short term rehabilitation   Labs/tests ordered: pending cbc, cmp    Oneal GroutMAHIMA Camelle Henkels, MD  [facility] [phone] (Monday-Friday 8 am - 5 pm) [phone] (afterhours)

## 2013-12-19 ENCOUNTER — Ambulatory Visit (HOSPITAL_COMMUNITY): Admission: RE | Admit: 2013-12-19 | Payer: Medicare Other | Source: Ambulatory Visit | Admitting: Gastroenterology

## 2013-12-19 ENCOUNTER — Encounter (HOSPITAL_COMMUNITY): Admission: RE | Payer: Self-pay | Source: Ambulatory Visit

## 2013-12-19 SURGERY — EGD (ESOPHAGOGASTRODUODENOSCOPY)
Anesthesia: Monitor Anesthesia Care

## 2014-01-08 ENCOUNTER — Inpatient Hospital Stay (HOSPITAL_COMMUNITY)
Admission: EM | Admit: 2014-01-08 | Discharge: 2014-02-03 | DRG: 208 | Disposition: E | Payer: Medicare Other | Attending: Internal Medicine | Admitting: Internal Medicine

## 2014-01-08 ENCOUNTER — Emergency Department (HOSPITAL_COMMUNITY): Payer: Medicare Other

## 2014-01-08 ENCOUNTER — Encounter (HOSPITAL_COMMUNITY): Payer: Self-pay | Admitting: Emergency Medicine

## 2014-01-08 DIAGNOSIS — Z982 Presence of cerebrospinal fluid drainage device: Secondary | ICD-10-CM

## 2014-01-08 DIAGNOSIS — K7200 Acute and subacute hepatic failure without coma: Secondary | ICD-10-CM | POA: Diagnosis present

## 2014-01-08 DIAGNOSIS — I469 Cardiac arrest, cause unspecified: Secondary | ICD-10-CM | POA: Diagnosis not present

## 2014-01-08 DIAGNOSIS — R739 Hyperglycemia, unspecified: Secondary | ICD-10-CM | POA: Diagnosis present

## 2014-01-08 DIAGNOSIS — Y92199 Unspecified place in other specified residential institution as the place of occurrence of the external cause: Secondary | ICD-10-CM | POA: Diagnosis not present

## 2014-01-08 DIAGNOSIS — I951 Orthostatic hypotension: Secondary | ICD-10-CM | POA: Diagnosis present

## 2014-01-08 DIAGNOSIS — E43 Unspecified severe protein-calorie malnutrition: Secondary | ICD-10-CM | POA: Diagnosis present

## 2014-01-08 DIAGNOSIS — I4891 Unspecified atrial fibrillation: Secondary | ICD-10-CM | POA: Diagnosis present

## 2014-01-08 DIAGNOSIS — J9600 Acute respiratory failure, unspecified whether with hypoxia or hypercapnia: Secondary | ICD-10-CM

## 2014-01-08 DIAGNOSIS — N179 Acute kidney failure, unspecified: Secondary | ICD-10-CM | POA: Diagnosis present

## 2014-01-08 DIAGNOSIS — G9341 Metabolic encephalopathy: Secondary | ICD-10-CM | POA: Diagnosis not present

## 2014-01-08 DIAGNOSIS — Z4659 Encounter for fitting and adjustment of other gastrointestinal appliance and device: Secondary | ICD-10-CM

## 2014-01-08 DIAGNOSIS — N19 Unspecified kidney failure: Secondary | ICD-10-CM

## 2014-01-08 DIAGNOSIS — W19XXXA Unspecified fall, initial encounter: Secondary | ICD-10-CM | POA: Diagnosis present

## 2014-01-08 DIAGNOSIS — I10 Essential (primary) hypertension: Secondary | ICD-10-CM | POA: Diagnosis present

## 2014-01-08 DIAGNOSIS — R269 Unspecified abnormalities of gait and mobility: Secondary | ICD-10-CM | POA: Diagnosis present

## 2014-01-08 DIAGNOSIS — E875 Hyperkalemia: Secondary | ICD-10-CM | POA: Diagnosis present

## 2014-01-08 DIAGNOSIS — W010XXA Fall on same level from slipping, tripping and stumbling without subsequent striking against object, initial encounter: Secondary | ICD-10-CM | POA: Diagnosis present

## 2014-01-08 DIAGNOSIS — F0390 Unspecified dementia without behavioral disturbance: Secondary | ICD-10-CM | POA: Diagnosis present

## 2014-01-08 DIAGNOSIS — R6520 Severe sepsis without septic shock: Secondary | ICD-10-CM | POA: Diagnosis not present

## 2014-01-08 DIAGNOSIS — K529 Noninfective gastroenteritis and colitis, unspecified: Secondary | ICD-10-CM | POA: Diagnosis present

## 2014-01-08 DIAGNOSIS — I959 Hypotension, unspecified: Secondary | ICD-10-CM | POA: Diagnosis present

## 2014-01-08 DIAGNOSIS — E860 Dehydration: Secondary | ICD-10-CM | POA: Diagnosis present

## 2014-01-08 DIAGNOSIS — R197 Diarrhea, unspecified: Secondary | ICD-10-CM

## 2014-01-08 DIAGNOSIS — D638 Anemia in other chronic diseases classified elsewhere: Secondary | ICD-10-CM | POA: Diagnosis present

## 2014-01-08 DIAGNOSIS — Z6821 Body mass index (BMI) 21.0-21.9, adult: Secondary | ICD-10-CM

## 2014-01-08 DIAGNOSIS — A419 Sepsis, unspecified organism: Secondary | ICD-10-CM | POA: Diagnosis not present

## 2014-01-08 DIAGNOSIS — I272 Pulmonary hypertension, unspecified: Secondary | ICD-10-CM | POA: Diagnosis present

## 2014-01-08 DIAGNOSIS — Z66 Do not resuscitate: Secondary | ICD-10-CM | POA: Diagnosis present

## 2014-01-08 DIAGNOSIS — A047 Enterocolitis due to Clostridium difficile: Secondary | ICD-10-CM | POA: Diagnosis present

## 2014-01-08 DIAGNOSIS — R531 Weakness: Secondary | ICD-10-CM

## 2014-01-08 DIAGNOSIS — K222 Esophageal obstruction: Secondary | ICD-10-CM | POA: Diagnosis present

## 2014-01-08 DIAGNOSIS — E785 Hyperlipidemia, unspecified: Secondary | ICD-10-CM | POA: Diagnosis present

## 2014-01-08 DIAGNOSIS — Z79899 Other long term (current) drug therapy: Secondary | ICD-10-CM

## 2014-01-08 DIAGNOSIS — F419 Anxiety disorder, unspecified: Secondary | ICD-10-CM | POA: Diagnosis present

## 2014-01-08 DIAGNOSIS — E872 Acidosis, unspecified: Secondary | ICD-10-CM

## 2014-01-08 DIAGNOSIS — J9601 Acute respiratory failure with hypoxia: Secondary | ICD-10-CM | POA: Diagnosis not present

## 2014-01-08 DIAGNOSIS — R1314 Dysphagia, pharyngoesophageal phase: Secondary | ICD-10-CM | POA: Diagnosis present

## 2014-01-08 DIAGNOSIS — J189 Pneumonia, unspecified organism: Secondary | ICD-10-CM | POA: Diagnosis present

## 2014-01-08 DIAGNOSIS — Z515 Encounter for palliative care: Secondary | ICD-10-CM | POA: Diagnosis not present

## 2014-01-08 DIAGNOSIS — Z8701 Personal history of pneumonia (recurrent): Secondary | ICD-10-CM | POA: Diagnosis not present

## 2014-01-08 DIAGNOSIS — R1310 Dysphagia, unspecified: Secondary | ICD-10-CM

## 2014-01-08 DIAGNOSIS — I350 Nonrheumatic aortic (valve) stenosis: Secondary | ICD-10-CM

## 2014-01-08 DIAGNOSIS — R092 Respiratory arrest: Secondary | ICD-10-CM

## 2014-01-08 DIAGNOSIS — J690 Pneumonitis due to inhalation of food and vomit: Principal | ICD-10-CM | POA: Diagnosis present

## 2014-01-08 DIAGNOSIS — N189 Chronic kidney disease, unspecified: Secondary | ICD-10-CM | POA: Diagnosis present

## 2014-01-08 LAB — I-STAT CHEM 8, ED
BUN: 48 mg/dL — ABNORMAL HIGH (ref 6–23)
CALCIUM ION: 1.09 mmol/L — AB (ref 1.13–1.30)
CHLORIDE: 105 meq/L (ref 96–112)
CREATININE: 2.4 mg/dL — AB (ref 0.50–1.10)
Glucose, Bld: 119 mg/dL — ABNORMAL HIGH (ref 70–99)
HCT: 31 % — ABNORMAL LOW (ref 36.0–46.0)
Hemoglobin: 10.5 g/dL — ABNORMAL LOW (ref 12.0–15.0)
Potassium: 4.6 mEq/L (ref 3.7–5.3)
Sodium: 137 mEq/L (ref 137–147)
TCO2: 21 mmol/L (ref 0–100)

## 2014-01-08 LAB — COMPREHENSIVE METABOLIC PANEL
ALT: 22 U/L (ref 0–35)
AST: 19 U/L (ref 0–37)
Albumin: 2.3 g/dL — ABNORMAL LOW (ref 3.5–5.2)
Alkaline Phosphatase: 108 U/L (ref 39–117)
Anion gap: 18 — ABNORMAL HIGH (ref 5–15)
BILIRUBIN TOTAL: 0.3 mg/dL (ref 0.3–1.2)
BUN: 53 mg/dL — ABNORMAL HIGH (ref 6–23)
CALCIUM: 8.5 mg/dL (ref 8.4–10.5)
CHLORIDE: 99 meq/L (ref 96–112)
CO2: 20 meq/L (ref 19–32)
Creatinine, Ser: 2.39 mg/dL — ABNORMAL HIGH (ref 0.50–1.10)
GFR, EST AFRICAN AMERICAN: 20 mL/min — AB (ref >90)
GFR, EST NON AFRICAN AMERICAN: 17 mL/min — AB (ref >90)
GLUCOSE: 124 mg/dL — AB (ref 70–99)
Potassium: 4.9 mEq/L (ref 3.7–5.3)
Sodium: 137 mEq/L (ref 137–147)
Total Protein: 6.2 g/dL (ref 6.0–8.3)

## 2014-01-08 LAB — CBC WITH DIFFERENTIAL/PLATELET
BASOS PCT: 0 % (ref 0–1)
Basophils Absolute: 0 10*3/uL (ref 0.0–0.1)
EOS PCT: 1 % (ref 0–5)
Eosinophils Absolute: 0.1 10*3/uL (ref 0.0–0.7)
HCT: 31.6 % — ABNORMAL LOW (ref 36.0–46.0)
HEMOGLOBIN: 10.5 g/dL — AB (ref 12.0–15.0)
LYMPHS ABS: 1.6 10*3/uL (ref 0.7–4.0)
Lymphocytes Relative: 11 % — ABNORMAL LOW (ref 12–46)
MCH: 31.2 pg (ref 26.0–34.0)
MCHC: 33.2 g/dL (ref 30.0–36.0)
MCV: 93.8 fL (ref 78.0–100.0)
MONO ABS: 1.8 10*3/uL — AB (ref 0.1–1.0)
MONOS PCT: 12 % (ref 3–12)
Neutro Abs: 11.2 10*3/uL — ABNORMAL HIGH (ref 1.7–7.7)
Neutrophils Relative %: 76 % (ref 43–77)
Platelets: 242 10*3/uL (ref 150–400)
RBC: 3.37 MIL/uL — AB (ref 3.87–5.11)
RDW: 14.1 % (ref 11.5–15.5)
WBC: 14.7 10*3/uL — AB (ref 4.0–10.5)

## 2014-01-08 LAB — URINALYSIS, ROUTINE W REFLEX MICROSCOPIC
Bilirubin Urine: NEGATIVE
GLUCOSE, UA: NEGATIVE mg/dL
HGB URINE DIPSTICK: NEGATIVE
KETONES UR: NEGATIVE mg/dL
Leukocytes, UA: NEGATIVE
Nitrite: NEGATIVE
PROTEIN: NEGATIVE mg/dL
Specific Gravity, Urine: 1.01 (ref 1.005–1.030)
UROBILINOGEN UA: 0.2 mg/dL (ref 0.0–1.0)
pH: 5 (ref 5.0–8.0)

## 2014-01-08 LAB — I-STAT TROPONIN, ED: Troponin i, poc: 0.04 ng/mL (ref 0.00–0.08)

## 2014-01-08 LAB — LIPASE, BLOOD: Lipase: 27 U/L (ref 11–59)

## 2014-01-08 LAB — I-STAT CG4 LACTIC ACID, ED: Lactic Acid, Venous: 2 mmol/L (ref 0.5–2.2)

## 2014-01-08 MED ORDER — SODIUM CHLORIDE 0.9 % IV BOLUS (SEPSIS)
500.0000 mL | Freq: Once | INTRAVENOUS | Status: DC
  Administered 2014-01-08: 500 mL via INTRAVENOUS

## 2014-01-08 MED ORDER — SODIUM CHLORIDE 0.9 % IV BOLUS (SEPSIS)
1000.0000 mL | Freq: Once | INTRAVENOUS | Status: AC
  Administered 2014-01-08: 1000 mL via INTRAVENOUS

## 2014-01-08 MED ORDER — SODIUM CHLORIDE 0.9 % IV SOLN
Freq: Once | INTRAVENOUS | Status: AC
  Administered 2014-01-08: via INTRAVENOUS

## 2014-01-08 MED ORDER — SODIUM CHLORIDE 0.9 % IV BOLUS (SEPSIS)
1000.0000 mL | Freq: Once | INTRAVENOUS | Status: AC
  Administered 2014-01-09: 1000 mL via INTRAVENOUS

## 2014-01-08 NOTE — ED Notes (Signed)
Bed: [id number] Expected date:  Expected time:  Means of arrival:  Comments: 80 yo fall from SNF

## 2014-01-08 NOTE — ED Notes (Signed)
[facility]. Per EMS pt lost her balance and fell. Denies pain and LOC. Pt has bruising to left elbow. Pt has had diarrhea x3 days and currently being treated for aspiration pneumonia.New dx of Afib. Pt is alert and oriented. Normally ambulatory with a walker. Lying on the floor b/p 102/68 and standing  b/p 86/58 no change in HR. CBG 186. Pt has had decrease in appetite and oral intake.

## 2014-01-08 NOTE — H&P (Signed)
PCP:   No primary care provider on file.   Chief Complaint:  Fall, weakness, GI issues  HPI: Patient is an 80 year old female who was admitted recently for aspiration pneumonia related to esophageal stricture and poor motility.  [facility] was [facility] where she underwent PT and returned to her usual level of independence at [facility] just 2 days ago.  Per report, she has not eaten well since returning home with continued GI complaints, as well as having a fall today while putting up her clothes, which prompted her to go to the ER.  Eval showed an elevated white count, a creatinine level of 2.4, when records show baseline 0.7, and low BP and generalized weakness, requiring admission.  Review of Systems:  Review of Systems  Constitutional: Negative for fever and chills.  Respiratory: Negative for cough, chest tightness and shortness of breath.  Cardiovascular: Negative for chest pain, palpitations and leg swelling.  Gastrointestinal: Negative for nausea, vomiting, abdominal pain and diarrhea.  Genitourinary: Negative for dysuria, flank pain and pelvic pain.  Musculoskeletal: Positive for arthralgias. Negative for myalgias, neck pain and neck stiffness. Denies head trauma with fall. Skin: Negative for rash.  Neurological: Negative for dizziness, syncope, weakness, light-headedness and headaches.  All other systems reviewed and are negative on 12 point review. Past Medical History: Past Medical History  Diagnosis Date  . Hypertension   . Hyperlipidemia   . Hydrocephalus 1990's  . Complication of anesthesia   . PONV (postoperative nausea and vomiting)   . Arthritis     hands   Past Surgical History  Procedure Laterality Date  . Csf shunt    . Fracture surgery      lt knee, rt elbow  . Esophagogastroduodenoscopy N/A 11/15/2013    Procedure: ESOPHAGOGASTRODUODENOSCOPY (EGD);  Surgeon: Louis Meckelobert D Kaplan, MD;  [facility];  Service: Endoscopy;  Laterality: N/A;   . Esophagogastroduodenoscopy N/A 12/13/2013    Procedure: ESOPHAGOGASTRODUODENOSCOPY (EGD);  Surgeon: Rachael Feeaniel P Jacobs, MD;  [facility];  Service: Endoscopy;  Laterality: N/A;    Medications: Prior to Admission medications   Medication Sig Start Date End Date Taking? Authorizing Provider  atorvastatin (LIPITOR) 10 MG tablet Take 10 mg by mouth every morning.  04/13/13  Yes Historical Provider, MD  diltiazem (CARDIZEM) 60 MG tablet Take 1 tablet (60 mg total) by mouth 2 (two) times daily. 12/14/13  Yes Jarome Matinaniel Paterson, MD  furosemide (LASIX) 40 MG tablet Take 1 tablet (40 mg total) by mouth daily. 12/14/13  Yes Jarome Matinaniel Paterson, MD  loperamide (IMODIUM) 2 MG capsule Take 2 mg by mouth daily as needed for diarrhea or loose stools.   Yes Historical Provider, MD  losartan (COZAAR) 100 MG tablet Take 100 mg by mouth every morning.  04/13/13  Yes Historical Provider, MD  Multiple Vitamin (MULTIVITAMIN WITH MINERALS) TABS tablet Take 1 tablet by mouth every morning.    Yes Historical Provider, MD  Multiple Vitamins-Minerals (ICAPS MV PO) Take 1 tablet by mouth every morning.    Yes Historical Provider, MD  Omega-3 Fatty Acids (FISH OIL PO) Take 1,000 mg by mouth daily.   Yes Historical Provider, MD  pantoprazole (PROTONIX) 40 MG tablet Take 40 mg by mouth 2 (two) times daily.   Yes Historical Provider, MD  Probiotic Product (ALIGN) 4 MG CAPS Take 4 mg by mouth daily.   Yes Historical Provider, MD  spironolactone (ALDACTONE) 25 MG tablet Take 1 tablet (25 mg total) by mouth daily. 12/14/13  Yes Jarome Matinaniel Paterson, MD  ALPRAZolam Prudy Feeler(XANAX) 0.5 MG tablet Take 0.5 mg by mouth at bedtime as needed for anxiety.    Historical Provider, MD    Allergies:  No Known Allergies  Social History:  reports that she has never smoked. She has never used smokeless tobacco. She reports that she does not drink alcohol or use illicit drugs.  Family History: History reviewed. No pertinent family  history.  Physical Exam: Filed Vitals:   01/05/2014 2130 01/29/2014 2151 01/17/2014 2200 01/23/2014 2330  BP: 86/42  86/67 93/45  Pulse:   77 88  Temp:  99.5 F (37.5 C)    TempSrc:  Rectal    Resp:    24  SpO2:   98% 96%   General appearance: alert, cooperative and appears stated age Head: Normocephalic, without obvious abnormality, atraumatic, VP shunt Eyes: conjunctivae/corneas clear. PERRL, EOM's intact.  Nose: Nares normal. Septum midline. Mucosa normal. No drainage or sinus tenderness. Throat: lips, mucosa, and tongue normal; teeth and gums normal Neck: no adenopathy, no carotid bruit, no JVD and thyroid not enlarged, symmetric, no tenderness/mass/nodules Resp: rales bibasilar Cardio: regular rate and rhythm, S1, S2 normal, Grade 3/6 murmur RUSB GI: soft, non-tender; bowel sounds hyperactive; no masses,  no organomegaly, VP shunt site nontender Extremities: extremities normal, atraumatic, no cyanosis or edema Pulses: 2+ and symmetric Lymph nodes: Cervical adenopathy: no cervical lymphadenopathy Neurologic: Alert and oriented X 3, normal strength and tone. Normal symmetric reflexes.     Labs on Admission:   Recent Labs  01/24/2014 2201 01/30/2014 2218  NA 137 137  K 4.9 4.6  CL 99 105  CO2 20  --   GLUCOSE 124* 119*  BUN 53* 48*  CREATININE 2.39* 2.40*  CALCIUM 8.5  --     Recent Labs  01/10/2014 2201  AST 19  ALT 22  ALKPHOS 108  BILITOT 0.3  PROT 6.2  ALBUMIN 2.3*    Recent Labs  01/05/2014 2201  LIPASE 27    Recent Labs  01/13/2014 2201 01/17/2014 2218  WBC 14.7*  --   NEUTROABS 11.2*  --   HGB 10.5* 10.5*  HCT 31.6* 31.0*  MCV 93.8  --   PLT 242  --     Radiological Exams on Admission: Dg Chest 2 View  01/22/2014   CLINICAL DATA:  Patient lost balance and fell. Bruising to the left elbow. Diarrhea for 3 days. Being treated for aspiration pneumonia.  EXAM: CHEST  2 VIEW  COMPARISON:  12/09/2013  FINDINGS: Since the previous study, there is improvement  of right perihilar infiltration. There is residual rounded opacity in the right hilum measuring 4.6 cm. This could represent residual pneumonia but followup until resolution is recommended to exclude an underlying mass lesion. Mild cardiac enlargement with mild pulmonary vascular congestion. No blunting of costophrenic angles. No pneumothorax. Degenerative changes in the spine. Ventricular peritoneal shunt tubing is visualized.  IMPRESSION: Improving right perihilar infiltration since previous study with residual rounded opacity in the right hilum. This could represent residual pneumonia above follow-up until resolution is recommended to exclude an underlying mass lesion. Cardiac enlargement with mild vascular congestion.   Electronically Signed   By: Burman NievesWilliam  Stevens M.D.   On: 01/19/2014 23:19   Dg Elbow Complete Left  01/22/2014   CLINICAL DATA:  Patient lost balance and fell. Bruising to the left elbow.  EXAM: LEFT ELBOW - COMPLETE 3+ VIEW  COMPARISON:  None.  FINDINGS: Degenerative changes in the left elbow with hypertrophic changes  present. No evidence of acute fracture or dislocation. No focal bone lesion or bone destruction. Soft tissues are unremarkable.  IMPRESSION: Degenerative changes.  No acute bony abnormalities.   Electronically Signed   By: Burman Nieves M.D.   On: 2014-02-03 23:21   Dg Abd 2 Views  February 03, 2014   CLINICAL DATA:  Diarrhea for 3 days, aspiration pneumonia, fell today, lactic acidosis  EXAM: ABDOMEN - 2 VIEW  COMPARISON:  None  FINDINGS: Scattered stool throughout colon.  Nonobstructive bowel gas pattern.  No bowel dilatation, bowel wall thickening or free air.  VP shunt tubing in abdomen.  High attenuation material projects over the sacrum question residual Pantopaque within sacral spinal canal versus superimposed calcified nodes.  Bones appear diffusely demineralized.  Few scattered atherosclerotic calcifications.  No urinary tract calcification seen.  IMPRESSION:  Nonobstructive bowel gas pattern.   Electronically Signed   By: Ulyses Southward M.D.   On: 02/03/14 23:21   11/15/2013   Procedure: ESOPHAGOGASTRODUODENOSCOPY (EGD); Surgeon: Louis Meckel, MD; [facility]; Service: Endoscopy; Laterality: N/A;  HPI:  Mrs. Cieslak is an 80 year old white female with a history of hydrocephalus status post VP shunt, chronic gait instability who presented to the emergency department with increased weakness and fall 2. She's had a progressive decline in her functional status over the past several weeks. This started about 3-4 weeks ago when she choked while eating dinner with possible aspiration. She underwent an EGD on 10/13 that showed an esophageal stricture with retained food extraction- started on soft diet with plans to dilate the esophagus soon. Her son reports poor po intake since that time. Had some blood tinged sputum which has resolved. No cough. She has had a progressive weakness prompting them to start PT at [facility] last week. Despite this, she continued to become increasingly weak and fell twice this morning. Her son also felt like she seemed more lethargic with increased confusion, issues with her balance, and possible slurred speech. No focal weakness but was concerned about the possibility of a stroke so he brought her to the emergency department. In the emergency department her head CT shows no acute abnormalities or intracranial hemorrhage. Additional workup revealed an elevated white blood count,probable right lower lobe pneumonia on chest x-ray. SLP spoke with PA Doug Sou earlier today regarding speech assessment. BSE performed on 12/07/13 was inadvertently overlooked during that conversation. Given concern for aspiration in addition to known esophageal issues, will proceed with MBS today.     Assessment / Plan / Recommendation  Clinical Impression  Dysphagia Diagnosis: Mild pharyngeal phase dysphagia  Treatment  Recommendation  Therapy as outlined in treatment plan below   Diet Recommendation Dysphagia 2 (Fine chop);Thin liquid   Liquid Administration via: Straw;Cup Medication Administration: Crushed with puree Supervision: Patient able to self feed;Full supervision/cueing for compensatory strategies Compensations: Slow rate;Small sips/bites;Follow solids with liquid Postural Changes and/or Swallow Maneuvers: Seated upright 90 degrees;Upright 30-60 min after meal   Other Recommendations Recommended Consults: MBS Oral Care Recommendations: Oral care BID Other Recommendations: Clarify dietary restrictions  Follow Up Recommendations  24 hour supervision/assistance   Frequency and Duration min 1 x/week  1 week  Pertinent Vitals/Pain VSS - RN present during study. No pain reported   SLP Swallow Goals  diet tolerance, adherence to safe swallow precautions    General Date of Onset:   Orders placed or performed during the hospital encounter of 02-03-2014  . EKG 12-Lead  . EKG 12-Lead    Assessment/Plan Active Problems:   *  No active hospital problems. * Dehydration:  She is s/p return back to [facility] Living for only a short period of time from her stay at [facility] for prior aspiration pneumonia.  Clearly with her rapid detioration, she was not ready to be back independently.  We will continue her hydration and start empiric abx.  Blood, urine cultures and C. Dif were obtained in the ER,  Also of concern was prior EGD done by Arlyce Dice showing stricture with suggestion of repeat dilatation. Esophageal Stricture.  Was seen by Arlyce Dice a month and a half ago, started PPI and suggested repeat intervention in 2-3 weeks from prior. Pneumonia: CXR shows continued opacification as per last admission, will need to be followed. Anxiety: Continue Xanax Hyperlipidemia: Continue Lipitor HTN:  Now BP i low range, will continue her low dose Cardizem, hold diuretics and ARB. VP shunt:  No current  issues  Sharline Lehane W 02-01-14, 11:49 PM

## 2014-01-08 NOTE — ED Provider Notes (Signed)
CSN: [id number]     Arrival date & time 01/12/2014  2127 History   First MD Initiated Contact with Patient 25-May-2013 2128     Chief Complaint  Patient presents with  . Fall     (Consider location/radiation/quality/duration/timing/severity/associated sxs/prior Treatment) HPI Katrina Flynn is a 80 y.o. female with hx of hypertension, hydrocephalus, a fib, aortic stenosis, presents to ED with complaint of a fall. Pt with recent aspiration pneumonia with esophageal strictures, admitted from 12/06/13 - 12/14/13, [facility]. Now lives alone at [facility]. Pt states today, was putting up her clothes and states she lost balance and fell. Denies feeling dizzy, light headed. Denies tripping. Remembers falling. States she did not hit her head. Only injury is left elbow pain. States she had to call for help to get up. Per EMS, pt is hypotensive and orthostatic. Spoke with pt's son, pt has not been eating well. Not drinking fluids. Also has had diarrhea for about 4 days, that she has intermittently taking imodium for. Pt denies any current complaints.   Past Medical History  Diagnosis Date  . Hypertension   . Hyperlipidemia   . Hydrocephalus 1990's  . Complication of anesthesia   . PONV (postoperative nausea and vomiting)   . Arthritis     hands   Past Surgical History  Procedure Laterality Date  . Csf shunt    . Fracture surgery      lt knee, rt elbow  . Esophagogastroduodenoscopy N/A 11/15/2013    Procedure: ESOPHAGOGASTRODUODENOSCOPY (EGD);  Surgeon: Louis Meckelobert D Kaplan, MD;  [facility];  Service: Endoscopy;  Laterality: N/A;  . Esophagogastroduodenoscopy N/A 12/13/2013    Procedure: ESOPHAGOGASTRODUODENOSCOPY (EGD);  Surgeon: Rachael Feeaniel P Jacobs, MD;  [facility];  Service: Endoscopy;  Laterality: N/A;   History reviewed. No pertinent family history. History  Substance Use Topics  . Smoking status: Never Smoker   .  Smokeless tobacco: Never Used  . Alcohol Use: No   OB History    No data available     Review of Systems  Constitutional: Negative for fever and chills.  Respiratory: Negative for cough, chest tightness and shortness of breath.   Cardiovascular: Negative for chest pain, palpitations and leg swelling.  Gastrointestinal: Negative for nausea, vomiting, abdominal pain and diarrhea.  Genitourinary: Negative for dysuria, flank pain and pelvic pain.  Musculoskeletal: Positive for arthralgias. Negative for myalgias, neck pain and neck stiffness.  Skin: Negative for rash.  Neurological: Negative for dizziness, syncope, weakness, light-headedness and headaches.  All other systems reviewed and are negative.     Allergies  Review of patient's allergies indicates no known allergies.  Home Medications   Prior to Admission medications   Medication Sig Start Date End Date Taking? Authorizing Provider  ALPRAZolam Prudy Feeler(XANAX) 0.5 MG tablet Take 0.5 mg by mouth at bedtime as needed for anxiety.    Historical Provider, MD  atorvastatin (LIPITOR) 10 MG tablet Take 10 mg by mouth every morning.  04/13/13   Historical Provider, MD  diltiazem (CARDIZEM) 60 MG tablet Take 1 tablet (60 mg total) by mouth 2 (two) times daily. 12/14/13   Jarome Matinaniel Paterson, MD  furosemide (LASIX) 40 MG tablet Take 1 tablet (40 mg total) by mouth daily. 12/14/13   Jarome Matinaniel Paterson, MD  losartan (COZAAR) 100 MG tablet Take 100 mg by mouth every morning.  04/13/13   Historical Provider, MD  moxifloxacin (AVELOX) 400 MG tablet Take 1 tablet (400 mg total) by  mouth daily at 8 pm. 12/14/13   Jarome Matin, MD  Multiple Vitamin (MULTIVITAMIN WITH MINERALS) TABS tablet Take 1 tablet by mouth every morning.     Historical Provider, MD  Multiple Vitamins-Minerals (ICAPS MV PO) Take 1 tablet by mouth every morning.     Historical Provider, MD  pantoprazole (PROTONIX) 40 MG tablet Take 40 mg by mouth 2 (two) times daily.    Historical Provider,  MD  spironolactone (ALDACTONE) 25 MG tablet Take 1 tablet (25 mg total) by mouth daily. 12/14/13   Jarome Matin, MD   BP 86/42 mmHg  Pulse 82  Temp(Src) 99.5 F (37.5 C) (Rectal)  Resp 22  SpO2 97% Physical Exam  Constitutional: She is oriented to person, place, and time. She appears well-developed and well-nourished. No distress.  HENT:  Head: Normocephalic.  Mouth/Throat: Oropharynx is clear and moist.  Eyes: Conjunctivae and EOM are normal. Pupils are equal, round, and reactive to light.  Neck: Normal range of motion. Neck supple.  Cardiovascular: Normal rate and regular rhythm.   Murmur heard. Pulmonary/Chest: Effort normal. No respiratory distress. She has no wheezes. She has rales.  Rales at bases bilaterally.  Abdominal: Soft. Bowel sounds are normal. She exhibits no distension. There is no tenderness. There is no rebound.  Musculoskeletal: She exhibits no edema.  Bruising to left elbow. Full ROM. Tenderness to palpation over lateral joint  Neurological: She is alert and oriented to person, place, and time. No cranial nerve deficit. Coordination normal.  Skin: Skin is warm and dry.  Psychiatric: She has a normal mood and affect. Her behavior is normal.  Nursing note and vitals reviewed.   ED Course  Procedures (including critical care time) Labs Review Labs Reviewed  CBC WITH DIFFERENTIAL - Abnormal; Notable for the following:    WBC 14.7 (*)    RBC 3.37 (*)    Hemoglobin 10.5 (*)    HCT 31.6 (*)    Lymphocytes Relative 11 (*)    Neutro Abs 11.2 (*)    Monocytes Absolute 1.8 (*)    All other components within normal limits  COMPREHENSIVE METABOLIC PANEL - Abnormal; Notable for the following:    Glucose, Bld 124 (*)    BUN 53 (*)    Creatinine, Ser 2.39 (*)    Albumin 2.3 (*)    GFR calc non Af Amer 17 (*)    GFR calc Af Amer 20 (*)    Anion gap 18 (*)    All other components within normal limits  I-STAT CHEM 8, ED - Abnormal; Notable for the following:     BUN 48 (*)    Creatinine, Ser 2.40 (*)    Glucose, Bld 119 (*)    Calcium, Ion 1.09 (*)    Hemoglobin 10.5 (*)    HCT 31.0 (*)    All other components within normal limits  CULTURE, BLOOD (ROUTINE X 2)  CULTURE, BLOOD (ROUTINE X 2)  CLOSTRIDIUM DIFFICILE BY PCR  STOOL CULTURE  URINE CULTURE  URINALYSIS, ROUTINE W REFLEX MICROSCOPIC  LIPASE, BLOOD  I-STAT CG4 LACTIC ACID, ED  I-STAT CG4 LACTIC ACID, ED  Rosezena Sensor, ED    Imaging Review Dg Chest 2 View  2014-01-10   CLINICAL DATA:  Patient lost balance and fell. Bruising to the left elbow. Diarrhea for 3 days. Being treated for aspiration pneumonia.  EXAM: CHEST  2 VIEW  COMPARISON:  12/09/2013  FINDINGS: Since the previous study, there is improvement of right perihilar infiltration. There is residual  rounded opacity in the right hilum measuring 4.6 cm. This could represent residual pneumonia but followup until resolution is recommended to exclude an underlying mass lesion. Mild cardiac enlargement with mild pulmonary vascular congestion. No blunting of costophrenic angles. No pneumothorax. Degenerative changes in the spine. Ventricular peritoneal shunt tubing is visualized.  IMPRESSION: Improving right perihilar infiltration since previous study with residual rounded opacity in the right hilum. This could represent residual pneumonia above follow-up until resolution is recommended to exclude an underlying mass lesion. Cardiac enlargement with mild vascular congestion.   Electronically Signed   By: Burman NievesWilliam  Stevens M.D.   On: 01/26/2014 23:19   Dg Elbow Complete Left  01/21/2014   CLINICAL DATA:  Patient lost balance and fell. Bruising to the left elbow.  EXAM: LEFT ELBOW - COMPLETE 3+ VIEW  COMPARISON:  None.  FINDINGS: Degenerative changes in the left elbow with hypertrophic changes present. No evidence of acute fracture or dislocation. No focal bone lesion or bone destruction. Soft tissues are unremarkable.  IMPRESSION: Degenerative  changes.  No acute bony abnormalities.   Electronically Signed   By: Burman NievesWilliam  Stevens M.D.   On: 02/02/2014 23:21   Dg Abd 2 Views  01/24/2014   CLINICAL DATA:  Diarrhea for 3 days, aspiration pneumonia, fell today, lactic acidosis  EXAM: ABDOMEN - 2 VIEW  COMPARISON:  None  FINDINGS: Scattered stool throughout colon.  Nonobstructive bowel gas pattern.  No bowel dilatation, bowel wall thickening or free air.  VP shunt tubing in abdomen.  High attenuation material projects over the sacrum question residual Pantopaque within sacral spinal canal versus superimposed calcified nodes.  Bones appear diffusely demineralized.  Few scattered atherosclerotic calcifications.  No urinary tract calcification seen.  IMPRESSION: Nonobstructive bowel gas pattern.   Electronically Signed   By: Ulyses SouthwardMark  Boles M.D.   On: 01/12/2014 23:21     EKG Interpretation None      MDM   Final diagnoses:  Lactic acidosis  Renal failure  Dehydration  Diarrhea  Hypotension, unspecified hypotension type    Pt here with a fall at home. Recent admission for aspiration pneumonia. Had esophagus stretched. At home by herself for last two  Days after a stay at rehab. Apparently not eating or drinking. Diarrhea for several days. Concerning for c diff. Pt is hypotensive, concerning for dehydration. IV bolus started. Will keep close eye on blood pressure. Sepsis work up and x-rays ordered. Pt is non toxic appearing, mentating at baseline. No pain.   12:00 AM Blood pressure remains in low 90s systolic. Patient states she's feeling well. Repeat chest x-ray shows improvement in the right lung aspiration pneumonia. Labs show elevated creatinine and BUN, creatinine is 2.4. Patient hydrated with 2 L of normal saline. Electrolytes and blood counts otherwise unremarkable. Lactic acid normal. UA normal. Given persistent hypotension, dehydration, renal failure, will admit.  Spoke with Dr.Tisovec, he will admit patient.  Filed Vitals:    01/30/2014 2151 01/25/2014 2200 01/12/2014 2330 01/09/14 0030  BP:  86/67 93/45 94/40   Pulse:  77 88 88  Temp: 99.5 F (37.5 C)     TempSrc: Rectal     Resp:   24 11  SpO2:  98% 96% 86%     Lottie Musselatyana A Troyce Febo, PA-C 01/09/14 0106  Richardean Canalavid H Yao, MD 01/09/14 1224

## 2014-01-09 ENCOUNTER — Inpatient Hospital Stay (HOSPITAL_COMMUNITY): Payer: Medicare Other

## 2014-01-09 DIAGNOSIS — K529 Noninfective gastroenteritis and colitis, unspecified: Secondary | ICD-10-CM | POA: Diagnosis present

## 2014-01-09 LAB — CBC
HCT: 31.4 % — ABNORMAL LOW (ref 36.0–46.0)
Hemoglobin: 10.1 g/dL — ABNORMAL LOW (ref 12.0–15.0)
MCH: 30.6 pg (ref 26.0–34.0)
MCHC: 32.2 g/dL (ref 30.0–36.0)
MCV: 95.2 fL (ref 78.0–100.0)
PLATELETS: 221 10*3/uL (ref 150–400)
RBC: 3.3 MIL/uL — ABNORMAL LOW (ref 3.87–5.11)
RDW: 14.1 % (ref 11.5–15.5)
WBC: 15.2 10*3/uL — ABNORMAL HIGH (ref 4.0–10.5)

## 2014-01-09 LAB — COMPREHENSIVE METABOLIC PANEL
ALT: 18 U/L (ref 0–35)
AST: 14 U/L (ref 0–37)
Albumin: 2 g/dL — ABNORMAL LOW (ref 3.5–5.2)
Alkaline Phosphatase: 92 U/L (ref 39–117)
Anion gap: 14 (ref 5–15)
BUN: 40 mg/dL — ABNORMAL HIGH (ref 6–23)
CALCIUM: 7.6 mg/dL — AB (ref 8.4–10.5)
CO2: 20 mEq/L (ref 19–32)
Chloride: 106 mEq/L (ref 96–112)
Creatinine, Ser: 1.85 mg/dL — ABNORMAL HIGH (ref 0.50–1.10)
GFR calc non Af Amer: 23 mL/min — ABNORMAL LOW (ref >90)
GFR, EST AFRICAN AMERICAN: 27 mL/min — AB (ref >90)
Glucose, Bld: 146 mg/dL — ABNORMAL HIGH (ref 70–99)
Potassium: 4.3 mEq/L (ref 3.7–5.3)
SODIUM: 140 meq/L (ref 137–147)
Total Bilirubin: 0.4 mg/dL (ref 0.3–1.2)
Total Protein: 5.5 g/dL — ABNORMAL LOW (ref 6.0–8.3)

## 2014-01-09 LAB — CLOSTRIDIUM DIFFICILE BY PCR: CDIFFPCR: POSITIVE — AB

## 2014-01-09 MED ORDER — METRONIDAZOLE IN NACL 5-0.79 MG/ML-% IV SOLN
500.0000 mg | Freq: Three times a day (TID) | INTRAVENOUS | Status: DC
  Administered 2014-01-09 – 2014-01-10 (×4): 500 mg via INTRAVENOUS
  Filled 2014-01-09 (×5): qty 100

## 2014-01-09 MED ORDER — BENEPROTEIN PO POWD
1.0000 | Freq: Three times a day (TID) | ORAL | Status: DC
  Filled 2014-01-09: qty 227

## 2014-01-09 MED ORDER — PROSIGHT PO TABS
ORAL_TABLET | Freq: Every day | ORAL | Status: DC
  Administered 2014-01-09: 1 via ORAL
  Filled 2014-01-09 (×2): qty 1

## 2014-01-09 MED ORDER — ONDANSETRON HCL 4 MG/2ML IJ SOLN
4.0000 mg | Freq: Four times a day (QID) | INTRAMUSCULAR | Status: DC | PRN
Start: 2014-01-09 — End: 2014-01-11

## 2014-01-09 MED ORDER — DILTIAZEM HCL 60 MG PO TABS
60.0000 mg | ORAL_TABLET | Freq: Two times a day (BID) | ORAL | Status: DC
  Filled 2014-01-09 (×2): qty 1

## 2014-01-09 MED ORDER — ALIGN 4 MG PO CAPS
4.0000 mg | ORAL_CAPSULE | Freq: Every day | ORAL | Status: DC
  Administered 2014-01-09: 4 mg via ORAL
  Filled 2014-01-09 (×2): qty 1

## 2014-01-09 MED ORDER — BENZONATATE 100 MG PO CAPS
100.0000 mg | ORAL_CAPSULE | Freq: Two times a day (BID) | ORAL | Status: DC | PRN
  Administered 2014-01-09 (×2): 100 mg via ORAL
  Filled 2014-01-09 (×2): qty 1

## 2014-01-09 MED ORDER — ACETAMINOPHEN 325 MG PO TABS
650.0000 mg | ORAL_TABLET | Freq: Four times a day (QID) | ORAL | Status: DC | PRN
  Administered 2014-01-09 (×2): 650 mg via ORAL
  Filled 2014-01-09 (×2): qty 2

## 2014-01-09 MED ORDER — ADULT MULTIVITAMIN W/MINERALS CH
1.0000 | ORAL_TABLET | Freq: Every day | ORAL | Status: DC
  Administered 2014-01-09: 1 via ORAL
  Filled 2014-01-09 (×2): qty 1

## 2014-01-09 MED ORDER — PANTOPRAZOLE SODIUM 40 MG PO TBEC
40.0000 mg | DELAYED_RELEASE_TABLET | Freq: Two times a day (BID) | ORAL | Status: DC
  Administered 2014-01-09 (×2): 40 mg via ORAL
  Filled 2014-01-09 (×5): qty 1

## 2014-01-09 MED ORDER — RESOURCE INSTANT PROTEIN PO PWD PACKET
1.0000 | Freq: Three times a day (TID) | ORAL | Status: DC
  Administered 2014-01-09: 6 g via ORAL
  Filled 2014-01-09 (×5): qty 6

## 2014-01-09 MED ORDER — ACETAMINOPHEN 650 MG RE SUPP: 650.0000 mg | Freq: Four times a day (QID) | RECTAL | Status: DC | PRN

## 2014-01-09 MED ORDER — ALPRAZOLAM 0.5 MG PO TABS
0.5000 mg | ORAL_TABLET | Freq: Every evening | ORAL | Status: DC | PRN
  Filled 2014-01-09: qty 1

## 2014-01-09 MED ORDER — CIPROFLOXACIN IN D5W 400 MG/200ML IV SOLN
400.0000 mg | INTRAVENOUS | Status: DC
  Administered 2014-01-09 – 2014-01-10 (×2): 400 mg via INTRAVENOUS
  Filled 2014-01-09 (×2): qty 200

## 2014-01-09 MED ORDER — SODIUM CHLORIDE 0.9 % IV BOLUS (SEPSIS)
1000.0000 mL | Freq: Once | INTRAVENOUS | Status: AC
  Administered 2014-01-09: 1000 mL via INTRAVENOUS

## 2014-01-09 MED ORDER — ALUM & MAG HYDROXIDE-SIMETH 200-200-20 MG/5ML PO SUSP: 30.0000 mL | Freq: Four times a day (QID) | ORAL | Status: DC | PRN

## 2014-01-09 MED ORDER — ONDANSETRON HCL 4 MG PO TABS: 4.0000 mg | ORAL_TABLET | Freq: Four times a day (QID) | ORAL | Status: DC | PRN

## 2014-01-09 MED ORDER — TRAZODONE HCL 50 MG PO TABS: 25.0000 mg | ORAL_TABLET | Freq: Every evening | ORAL | Status: DC | PRN

## 2014-01-09 MED ORDER — ATORVASTATIN CALCIUM 10 MG PO TABS
10.0000 mg | ORAL_TABLET | Freq: Every day | ORAL | Status: DC
  Administered 2014-01-09: 10 mg via ORAL
  Filled 2014-01-09 (×2): qty 1

## 2014-01-09 MED ORDER — VANCOMYCIN 50 MG/ML ORAL SOLUTION
125.0000 mg | Freq: Four times a day (QID) | ORAL | Status: DC
  Administered 2014-01-09 (×3): 125 mg via ORAL
  Filled 2014-01-09 (×7): qty 2.5

## 2014-01-09 MED ORDER — OMEGA-3-ACID ETHYL ESTERS 1 G PO CAPS
1.0000 g | ORAL_CAPSULE | Freq: Two times a day (BID) | ORAL | Status: DC
  Administered 2014-01-09 (×2): 1 g via ORAL
  Filled 2014-01-09 (×4): qty 1

## 2014-01-09 MED ORDER — POTASSIUM CHLORIDE IN NACL 20-0.9 MEQ/L-% IV SOLN
INTRAVENOUS | Status: DC
  Administered 2014-01-09 – 2014-01-10 (×2): via INTRAVENOUS
  Filled 2014-01-09 (×5): qty 1000

## 2014-01-09 MED ORDER — VANCOMYCIN HCL IN DEXTROSE 1-5 GM/200ML-% IV SOLN
1000.0000 mg | INTRAVENOUS | Status: DC
  Administered 2014-01-09: 1000 mg via INTRAVENOUS
  Filled 2014-01-09: qty 200

## 2014-01-09 MED ORDER — ENOXAPARIN SODIUM 30 MG/0.3ML ~~LOC~~ SOLN
30.0000 mg | SUBCUTANEOUS | Status: DC
  Administered 2014-01-09 – 2014-01-11 (×2): 30 mg via SUBCUTANEOUS
  Filled 2014-01-09 (×3): qty 0.3

## 2014-01-09 MED ORDER — LOPERAMIDE HCL 2 MG PO CAPS: 2.0000 mg | ORAL_CAPSULE | Freq: Every day | ORAL | Status: DC | PRN

## 2014-01-09 NOTE — Progress Notes (Signed)
INITIAL NUTRITION ASSESSMENT  DOCUMENTATION CODES Per approved criteria  -Severe malnutrition in the context of acute illness or injury  Pt meets criteria for severe MALNUTRITION in the context of acute illness as evidenced by energy intake <75% for >1 month and 6% wt loss x 1 month.  INTERVENTION: -Provide Beneprotein protein supplement TID w/meals, each supplement provides 25 kcal, 6 gram protein -Encourage PO intake -RD to continue to monitor  NUTRITION DIAGNOSIS: Inadequate oral intake related to decreased appetite as evidenced by 6% wt loss x 1 month.   Goal: Pt to meet >/= 90% of their estimated nutrition needs   Monitor:  PO and supplemental intake, weight, labs, I/O's  Reason for Assessment: Consult for poor PO intake  Admitting Dx: Pneumonia  ASSESSMENT: 80 year old female who was admitted recently for aspiration pneumonia related to esophageal stricture and poor motility. Per report, she has not eaten well since returning home with continued GI complaints.   PO intake: 90% of clear liquids.   Pt was assessed on 11/4 by RD, pt with swallowing difficulties. Pt's appetite has not improved since that admission and continues to eat poorly. Pt reports no appetite but no further wt loss. Pt's weight has increased 3 lb since November, 6% wt loss x 1 month, significant for time frame.  Per RN in room, pt has coughed up red from jello this morning. Pt with diarrhea, C.diff pending. Pt is scheduled for x-ray of esophagus today.  Pt states that she does not like Ensure or Boost supplements. Pt declines trying Raytheonesource Breeze. Pt would like to try Magic Cups if diet is advanced to full liquids. Pt is open to trying Beneprotein with meals.  Labs reviewed: Elevated BUN & Creatinine Glucose 146  Height: Ht Readings from Last 1 Encounters:  01/09/14 5\' 6"  (1.676 m)    Weight: Wt Readings from Last 1 Encounters:  01/09/14 135 lb 2.3 oz (61.3 kg)    Ideal Body Weight: 130  lb  % Ideal Body Weight: 104%  Wt Readings from Last 10 Encounters:  01/09/14 135 lb 2.3 oz (61.3 kg)  12/16/13 132 lb (59.875 kg)  12/15/13 132 lb (59.875 kg)  12/10/13 143 lb 1.3 oz (64.9 kg)  11/15/13 140 lb (63.504 kg)    Usual Body Weight:  140 lb  % Usual Body Weight: 96%  BMI:  Body mass index is 21.82 kg/(m^2).  Estimated Nutritional Needs: Kcal: 1550-1750  Protein: 75-85g Fluid: 1.5L/day  Skin: MSAD on buttocks  Diet Order: Diet clear liquid  EDUCATION NEEDS: -No education needs identified at this time   Intake/Output Summary (Last 24 hours) at 01/09/14 1132 Last data filed at 01/09/14 1011  Gross per 24 hour  Intake   3240 ml  Output   1000 ml  Net   2240 ml    Last BM: 12/7- diarrhea  Labs:   Recent Labs Lab 01/10/2014 2201 01/07/2014 2218 01/09/14 0359  NA 137 137 140  K 4.9 4.6 4.3  CL 99 105 106  CO2 20  --  20  BUN 53* 48* 40*  CREATININE 2.39* 2.40* 1.85*  CALCIUM 8.5  --  7.6*  GLUCOSE 124* 119* 146*    CBG (last 3)  No results for input(s): GLUCAP in the last 72 hours.  Scheduled Meds: . ALIGN  4 mg Oral Daily  . atorvastatin  10 mg Oral Daily  . ciprofloxacin  400 mg Intravenous Q24H  . enoxaparin (LOVENOX) injection  30 mg Subcutaneous Q24H  .  metronidazole  500 mg Intravenous Q8H  . multivitamin   Oral Daily  . multivitamin with minerals  1 tablet Oral Daily  . omega-3 acid ethyl esters  1 g Oral BID  . pantoprazole  40 mg Oral BID  . vancomycin  1,000 mg Intravenous Q48H    Continuous Infusions: . 0.9 % NaCl with KCl 20 mEq / L 100 mL/hr at 01/09/14 0147    Past Medical History  Diagnosis Date  . Hypertension   . Hyperlipidemia   . Hydrocephalus 1990's  . Complication of anesthesia   . PONV (postoperative nausea and vomiting)   . Arthritis     hands    Past Surgical History  Procedure Laterality Date  . Csf shunt    . Fracture surgery      lt knee, rt elbow  . Esophagogastroduodenoscopy N/A 11/15/2013     Procedure: ESOPHAGOGASTRODUODENOSCOPY (EGD);  Surgeon: Louis Meckelobert D Kaplan, MD;  [facility];  Service: Endoscopy;  Laterality: N/A;  . Esophagogastroduodenoscopy N/A 12/13/2013    Procedure: ESOPHAGOGASTRODUODENOSCOPY (EGD);  Surgeon: Rachael Feeaniel P Jacobs, MD;  [facility];  Service: Endoscopy;  Laterality: N/A;    Tilda FrancoLindsey Aksel Bencomo, MS, RD, LDN Pager: [phone] After Hours Pager: [phone]

## 2014-01-09 NOTE — Progress Notes (Signed)
ANTIBIOTIC CONSULT NOTE - INITIAL  Pharmacy Consult for Vancomycin Indication: Bacteremia  No Known Allergies  Patient Measurements: Height: 5\' 6"  (167.6 cm) Weight: 135 lb 2.3 oz (61.3 kg) IBW/kg (Calculated) : 59.3  Vital Signs: Temp: 98.1 F (36.7 C) (12/07 0137) Temp Source: Oral (12/07 0137) BP: 76/38 mmHg (12/07 0520) Pulse Rate: 88 (12/07 0520) Intake/Output from previous day: 12/06 0701 - 12/07 0700 In: 1721.7 [I.V.:421.7; IV Piggyback:1300] Out: -  Intake/Output from this shift: Total I/O In: 1721.7 [I.V.:421.7; IV Piggyback:1300] Out: -   Labs:  Recent Labs  01/29/2014 2201 01/06/2014 2218 01/09/14 0359  WBC 14.7*  --  15.2*  HGB 10.5* 10.5* 10.1*  PLT 242  --  221  CREATININE 2.39* 2.40* 1.85*   Estimated Creatinine Clearance: 19.3 mL/min (by C-G formula based on Cr of 1.85). No results for input(s): VANCOTROUGH, VANCOPEAK, VANCORANDOM, GENTTROUGH, GENTPEAK, GENTRANDOM, TOBRATROUGH, TOBRAPEAK, TOBRARND, AMIKACINPEAK, AMIKACINTROU, AMIKACIN in the last 72 hours.   Microbiology: No results found for this or any previous visit (from the past 720 hour(s)).  Medical History: Past Medical History  Diagnosis Date  . Hypertension   . Hyperlipidemia   . Hydrocephalus 1990's  . Complication of anesthesia   . PONV (postoperative nausea and vomiting)   . Arthritis     hands    Medications:  Scheduled:  . ALIGN  4 mg Oral Daily  . atorvastatin  10 mg Oral Daily  . ciprofloxacin  400 mg Intravenous Q24H  . diltiazem  60 mg Oral BID  . enoxaparin (LOVENOX) injection  30 mg Subcutaneous Q24H  . metronidazole  500 mg Intravenous Q8H  . multivitamin   Oral Daily  . multivitamin with minerals  1 tablet Oral Daily  . omega-3 acid ethyl esters  1 g Oral BID  . pantoprazole  40 mg Oral BID  . vancomycin  1,000 mg Intravenous Q48H   Infusions:  . 0.9 % NaCl with KCl 20 mEq / L 100 mL/hr at 01/09/14 0147   Assessment:  Katrina Flynn with recent  hospitalization for aspiration PNA, returns today with GI complaints, weakness and decreased appetite  MD initiated Flagyl and Cipro  Blood, urine and C Diff cultures ordered  Pharmacy consulted to dose Vancomycin for bacteremia  Goal of Therapy:  Vancomycin trough level 15-20 mcg/ml  Plan:  Measure antibiotic drug levels at steady state Follow up culture results  Vancomycin 1gm IV q24h  Kalman Nylen, Joselyn GlassmanLeann Trefz, PharmD 01/09/2014,6:46 AM

## 2014-01-09 NOTE — Progress Notes (Signed)
Pt has been hypotensive since admission to the unit, with most recent BP at 0520 76/38 manually. Paged MD regarding this most recent BP and received order for 1L NS bolus. MD stated they would see pt on rounds in about 1.5 hours. Pts other vital signs are stable and she has no complaints at this time. Mick SellShannon Halima Fogal RN

## 2014-01-09 NOTE — Progress Notes (Signed)
PT Cancellation Note  Patient Details Name: Katrina Flynn MRN: [id number] DOB: [birth year]   Cancelled Treatment:    Reason Eval/Treat Not Completed: Other (comment) (pt declines physical therapy today, agreeable for PT to check back tomorrow.)   Trenten Watchman,[name] 01/09/2014, 11:10 AM Zenovia JarredKati Rowyn Mustapha, PT, DPT 01/09/2014 Pager: [phone]

## 2014-01-09 NOTE — Progress Notes (Signed)
Clinical Social Work Department BRIEF PSYCHOSOCIAL ASSESSMENT 01/09/2014  Patient:  Katrina Flynn,Katrina Flynn     Account Number:  [id number]     Admit date:  01/23/2014  Clinical Social Worker:  Garlan FairKIDD,Tyrah Broers, LCSWA  Date/Time:  01/09/2014 03:40 PM  Referred by:  Physician  Date Referred:  01/09/2014 Referred for  SNF Placement   Other Referral:   Interview type:  Family Other interview type:    PSYCHOSOCIAL DATA Living Status:  ALONE Admitted from facility:  [facility] Level of care:  Independent Living Primary support name:  Tammy SoursGreg Shishido/son/[phone] Primary support relationship to patient:  CHILD, ADULT Degree of support available:   strong    CURRENT CONCERNS Current Concerns  Post-Acute Placement   Other Concerns:    SOCIAL WORK ASSESSMENT / PLAN CSW received referral that [facility] and recently returned there after [facility]. Per MD, pt likely will need rehab at SNF again upon discharge.    CSW visited pt room. Pt son present at this time. Pt son came out of room to speak with CSW as pt was resting at this time. Pt son confirmed that [facility]. CSW discussed that CSW awaiting PT evaluation, but potential that pt will require rehab at SNF again upon discharge. Pt son stated that if SNF recommended then plan would be for pt to [facility]. If PT recommends HH, [facility] Pt son shared that pt [facility]. CSW confirmed pt son's contact information and will update pt son upon recommendation from PT.    CSW to await PT evaluation in order to determine dispostion planning and initate insurance authorization through [facility].    [facility] and notified facility that pt is a potential for SNF again.    CSW completed FL2 and [facility] to [facility].    CSW to await PT evaluation to ensure need for rehab in order to get [facility]ic[facility] insurance authorization.    CSW to continue to follow to provide support and assist with pt discharge planning needs.   Assessment/plan status:  Psychosocial Support/Ongoing Assessment of Needs Other assessment/ plan:   discharge planning   Information/referral to community resources:   [facility]    PATIENT'S/FAMILY'S RESPONSE TO PLAN OF CARE: Pt alert and oriented x 4, but currently resting and did not want to be disturbed. Pt son supportive and actively involved in pt care. Pt son states plan if rehab at SNF recommendation would be for [facility] to [facility].    Loletta SpecterSuzanna Nachum Derossett, MSW, LCSW Clinical Social Work [phone]

## 2014-01-09 NOTE — Progress Notes (Signed)
Pt admitted to 1344, no family present. Pt cleansed of incont stool, peri area and buttocks with extensive redness barrier cream applied. Foley catheter inserted per MD order. O2 at 2L, pt with productive cough moderate amt yellow sputum. Remains hypotensive but stable. Admission tasks completed, will continue to monitor. Mick SellShannon Jennell Janosik RN

## 2014-01-09 NOTE — Plan of Care (Signed)
Problem: Phase I Progression Outcomes Goal: Voiding-avoid urinary catheter unless indicated Outcome: Progressing Has foley due to stool incontinence with her cough buttocks and peri area very reddened

## 2014-01-09 NOTE — Plan of Care (Signed)
Problem: Phase I Progression Outcomes Goal: Hemodynamically stable Outcome: Progressing     

## 2014-01-09 NOTE — Care Management Note (Signed)
CARE MANAGEMENT NOTE 01/09/2014  Patient:  Katrina Flynn,Katrina Flynn   Account Number:  [id number]  Date Initiated:  01/09/2014  Documentation initiated by:  Sandford CrazeLEMENTS,Yusif Gnau  Subjective/Objective Assessment:   80 yo admitted with Gastroenteritis and dehydration. PMH of Hypertension, Hyperlipidemia, Hydrocephalus and recent admission for aspiration pneumonia related to esophageal stricture and poor motility      Action/Plan:   From [facility].   Anticipated DC Date:  01/12/2014   Anticipated DC Plan:  SKILLED NURSING FACILITY  In-house referral  Clinical Social Worker      DC Planning Services  CM consult      Choice offered to / List presented to:             Status of service:  In process, will continue to follow Medicare Important Message given?   (If response is "NO", the following Medicare IM given date fields will be blank) Date Medicare IM given:   Medicare IM given by:   Date Additional Medicare IM given:   Additional Medicare IM given by:    Discharge Disposition:    Per UR Regulation:  Reviewed for med. necessity/level of care/duration of stay  If discussed at Long Length of Stay Meetings, dates discussed:    Comments:  01/09/14 Sandford Crazeora Jerome Viglione RN,BSN,NCM [phone] Chart reviewed and CM following for DC needs.  PT/OT to evaluate pt and give DC recommendations.  Will await and assist as needed.

## 2014-01-09 NOTE — Progress Notes (Signed)
OT Cancellation Note  Patient Details Name: Waylynn Buckwalter MRN: [id number] DOB: 1923/01/22   Cancelled Treatment:    Reason Eval/Treat Not Completed: Other (comment) pt declines occupational therapy today, agreeable for OT to check back tomorrow  Indyah Saulnier, Metro KungLorraine D 01/09/2014, 1:34 PM

## 2014-01-09 NOTE — Progress Notes (Signed)
Subjective: She continues to have moderate diarrhea and feels weak, not complaining of difficulty swallowing.  Objective: Vital signs in last 24 hours: Temp:  [98.1 F (36.7 C)-99.5 F (37.5 C)] 98.1 F (36.7 C) (12/07 0137) Pulse Rate:  [77-112] 92 (12/07 0834) Resp:  [11-26] 26 (12/07 0834) BP: (76-117)/(32-67) 84/40 mmHg (12/07 0834) SpO2:  [86 %-98 %] 96 % (12/07 0834) Weight:  [61.3 kg (135 lb 2.3 oz)] 61.3 kg (135 lb 2.3 oz) (12/07 0137) Weight change:    Intake/Output from previous day: 12/06 0701 - 12/07 0700 In: 1721.7 [I.V.:421.7; IV Piggyback:1300] Out: -    General appearance: alert, cooperative and no distress Resp: clear to auscultation bilaterally Cardio: regular rate and rhythm and with a 2/6 systolic ejection murmur GI: soft, non-tender; bowel sounds normal; no masses,  no organomegaly Extremities: extremities normal, atraumatic, no cyanosis or edema  Lab Results:  Recent Labs  01/12/2014 2201 01/30/2014 2218 01/09/14 0359  WBC 14.7*  --  15.2*  HGB 10.5* 10.5* 10.1*  HCT 31.6* 31.0* 31.4*  PLT 242  --  221   BMET  Recent Labs  01/29/2014 2201 01/10/2014 2218 01/09/14 0359  NA 137 137 140  K 4.9 4.6 4.3  CL 99 105 106  CO2 20  --  20  GLUCOSE 124* 119* 146*  BUN 53* 48* 40*  CREATININE 2.39* 2.40* 1.85*  CALCIUM 8.5  --  7.6*   CMET CMP     Component Value Date/Time   NA 140 01/09/2014 0359   K 4.3 01/09/2014 0359   CL 106 01/09/2014 0359   CO2 20 01/09/2014 0359   GLUCOSE 146* 01/09/2014 0359   BUN 40* 01/09/2014 0359   CREATININE 1.85* 01/09/2014 0359   CALCIUM 7.6* 01/09/2014 0359   PROT 5.5* 01/09/2014 0359   ALBUMIN 2.0* 01/09/2014 0359   AST 14 01/09/2014 0359   ALT 18 01/09/2014 0359   ALKPHOS 92 01/09/2014 0359   BILITOT 0.4 01/09/2014 0359   GFRNONAA 23* 01/09/2014 0359   GFRAA 27* 01/09/2014 0359    CBG (last 3)  No results for input(s): GLUCAP in the last 72 hours.  INR RESULTS:  No results found for: INR, PROTIME    Studies/Results: Dg Chest 2 View  02/01/2014   CLINICAL DATA:  Patient lost balance and fell. Bruising to the left elbow. Diarrhea for 3 days. Being treated for aspiration pneumonia.  EXAM: CHEST  2 VIEW  COMPARISON:  12/09/2013  FINDINGS: Since the previous study, there is improvement of right perihilar infiltration. There is residual rounded opacity in the right hilum measuring 4.6 cm. This could represent residual pneumonia but followup until resolution is recommended to exclude an underlying mass lesion. Mild cardiac enlargement with mild pulmonary vascular congestion. No blunting of costophrenic angles. No pneumothorax. Degenerative changes in the spine. Ventricular peritoneal shunt tubing is visualized.  IMPRESSION: Improving right perihilar infiltration since previous study with residual rounded opacity in the right hilum. This could represent residual pneumonia above follow-up until resolution is recommended to exclude an underlying mass lesion. Cardiac enlargement with mild vascular congestion.   Electronically Signed   By: Burman NievesWilliam  Stevens M.D.   On: 01/06/2014 23:19   Dg Elbow Complete Left  01/10/2014   CLINICAL DATA:  Patient lost balance and fell. Bruising to the left elbow.  EXAM: LEFT ELBOW - COMPLETE 3+ VIEW  COMPARISON:  None.  FINDINGS: Degenerative changes in the left elbow with hypertrophic changes present. No evidence of acute fracture or dislocation. No  focal bone lesion or bone destruction. Soft tissues are unremarkable.  IMPRESSION: Degenerative changes.  No acute bony abnormalities.   Electronically Signed   By: Burman NievesWilliam  Stevens M.D.   On: 01/12/2014 23:21   Dg Abd 2 Views  01/28/2014   CLINICAL DATA:  Diarrhea for 3 days, aspiration pneumonia, fell today, lactic acidosis  EXAM: ABDOMEN - 2 VIEW  COMPARISON:  None  FINDINGS: Scattered stool throughout colon.  Nonobstructive bowel gas pattern.  No bowel dilatation, bowel wall thickening or free air.  VP shunt tubing in abdomen.   High attenuation material projects over the sacrum question residual Pantopaque within sacral spinal canal versus superimposed calcified nodes.  Bones appear diffusely demineralized.  Few scattered atherosclerotic calcifications.  No urinary tract calcification seen.  IMPRESSION: Nonobstructive bowel gas pattern.   Electronically Signed   By: Ulyses SouthwardMark  Boles M.D.   On: 01/17/2014 23:21    Medications: I have reviewed the patient's current medications.  Assessment/Plan: #1 Diarrhea and leukocytosis: due to pneumonia versus clostridium difficile colitis. We will continue current antibiotics and check a stool for clostridium  Difficile. #2 Hypotension: moderate and she appears to remain in a sinus rhythm so we will hold cardizem for now #3 Gait Instability: multifactorial and due to NPH as well as hypotension. Will have PT and OT evaluate her #4 Acute Kidney Injury: improving and likely due to hypotension and hypoperfusion #5 Protein calorie malnutrition: severe and we will recheck a barium swallow test.  LOS: 1 day   Katrina Flynn G 01/09/2014, 9:50 AM

## 2014-01-09 NOTE — Progress Notes (Signed)
Clinical Social Work Department CLINICAL SOCIAL WORK PLACEMENT NOTE 01/09/2014  Patient:  Katrina Flynn,Katrina Flynn  Account Number:  [id number] Admit date:  02/09/13  Clinical Social Worker:  Garlan FairSUZANNA Inez Stantz, LCSWA  Date/time:  01/09/2014 03:45 PM  Clinical Social Work is seeking post-discharge placement for this patient at the following level of care:   SKILLED NURSING   (*CSW will update this form in Epic as items are completed)   01/09/2014  Patient/family provided with [facility] Department of Clinical Social Work's list of facilities offering this level of care within the geographic area requested by the patient (or if unable, by the patient's family).  01/09/2014  Patient/family informed of their freedom to choose among providers that offer the needed level of care, that participate in Medicare, Medicaid or managed care program needed by the patient, have an available bed and are willing to accept the patient.  01/09/2014  Patient/family informed of [facility]' ownership interest in [facility], as well as of the fact that they are under no obligation to receive care at this facility.  PASARR submitted to EDS on 01/09/2014 PASARR number received on 01/09/2014  FL2 transmitted to all facilities in geographic area requested by pt/family on  01/09/2014 FL2 transmitted to all facilities within larger geographic area on   Patient informed that his/her managed care company has contracts with or will negotiate with  certain facilities, including the following:     Patient/family informed of bed offers received:   Patient chooses bed at  Physician recommends and patient chooses bed at    Patient to be transferred to  on   Patient to be transferred to facility by  Patient and family notified of transfer on  Name of family member notified:    The following physician request were entered in Epic:   Additional Comments: Initiated [facility] as pt has been there recently  and [facility]   Loletta SpecterSuzanna Tesneem Dufrane, MSW, LCSW Clinical Social Work [phone]

## 2014-01-09 NOTE — Plan of Care (Signed)
Problem: Phase I Progression Outcomes Goal: Pain controlled with appropriate interventions Outcome: Completed/Met Date Met:  01/09/14 Denies any pain

## 2014-01-09 NOTE — ED Notes (Signed)
RN concerned with pt hypotension even after 3L of fluids. Contacted admitting MD to make sure MD wanted pt to go to non tele floor. MD stated he was aware of pt hypotension and would like for pt to remain on non tele floor. [facility].

## 2014-01-09 NOTE — Progress Notes (Signed)
Pt incontinent of soft brown stool every time she coughs. Stool sample sent to lab for r/o c-diff. Pt's cough increasing, more productive, refuses anything for cough. Pt's appetite improving. Repositioning q 3hr. Buttocks and peri area very reddened from stool incontinence. Cleansed and barrier cream applied with every incontinence of stool. BP also coming up 102/68. O2 @ 2L.

## 2014-01-09 NOTE — Plan of Care (Signed)
Problem: Phase I Progression Outcomes Goal: OOB as tolerated unless otherwise ordered Outcome: Progressing     

## 2014-01-09 NOTE — Plan of Care (Signed)
Problem: Phase I Progression Outcomes Goal: Initial discharge plan identified Outcome: Progressing     

## 2014-01-09 NOTE — Progress Notes (Addendum)
Pt's T-100.2 aux. Continuely coughing. C-diff came back positive. Dr. Eloise HarmanPaterson called. Dr. Clelia CroftShaw was covering. Informed of Temp,cough and c-diff. He stated he would put in orders for her. Tylenol 650 mg given PO. Tessalon pearl 100 mg given. Cough decreased, pt sleeping. Pt refuses to be repositioned. Esophageal x-ray done

## 2014-01-10 ENCOUNTER — Inpatient Hospital Stay (HOSPITAL_COMMUNITY): Payer: Medicare Other

## 2014-01-10 ENCOUNTER — Inpatient Hospital Stay (HOSPITAL_COMMUNITY): Payer: Medicare Other | Admitting: Anesthesiology

## 2014-01-10 ENCOUNTER — Encounter (HOSPITAL_COMMUNITY): Payer: Self-pay | Admitting: Specialist

## 2014-01-10 DIAGNOSIS — I469 Cardiac arrest, cause unspecified: Secondary | ICD-10-CM

## 2014-01-10 DIAGNOSIS — J9601 Acute respiratory failure with hypoxia: Secondary | ICD-10-CM

## 2014-01-10 DIAGNOSIS — I4891 Unspecified atrial fibrillation: Secondary | ICD-10-CM

## 2014-01-10 DIAGNOSIS — J690 Pneumonitis due to inhalation of food and vomit: Principal | ICD-10-CM

## 2014-01-10 LAB — CBC WITH DIFFERENTIAL/PLATELET
BASOS PCT: 1 % (ref 0–1)
Basophils Absolute: 0.3 10*3/uL — ABNORMAL HIGH (ref 0.0–0.1)
EOS PCT: 0 % (ref 0–5)
Eosinophils Absolute: 0 10*3/uL (ref 0.0–0.7)
HCT: 32.8 % — ABNORMAL LOW (ref 36.0–46.0)
Hemoglobin: 10.6 g/dL — ABNORMAL LOW (ref 12.0–15.0)
Lymphocytes Relative: 20 % (ref 12–46)
Lymphs Abs: 5.2 10*3/uL — ABNORMAL HIGH (ref 0.7–4.0)
MCH: 31.4 pg (ref 26.0–34.0)
MCHC: 32.3 g/dL (ref 30.0–36.0)
MCV: 97 fL (ref 78.0–100.0)
MONOS PCT: 5 % (ref 3–12)
Monocytes Absolute: 1.3 10*3/uL — ABNORMAL HIGH (ref 0.1–1.0)
NEUTROS PCT: 74 % (ref 43–77)
Neutro Abs: 19.1 10*3/uL — ABNORMAL HIGH (ref 1.7–7.7)
PLATELETS: 183 10*3/uL (ref 150–400)
RBC: 3.38 MIL/uL — AB (ref 3.87–5.11)
RDW: 14.5 % (ref 11.5–15.5)
WBC: 25.9 10*3/uL — AB (ref 4.0–10.5)

## 2014-01-10 LAB — BLOOD GAS, ARTERIAL
Acid-base deficit: 12.6 mmol/L — ABNORMAL HIGH (ref 0.0–2.0)
BICARBONATE: 13.4 meq/L — AB (ref 20.0–24.0)
DRAWN BY: 331471
FIO2: 1 %
LHR: 14 {breaths}/min
MECHVT: 530 mL
O2 Saturation: 94.9 %
PCO2 ART: 32.4 mmHg — AB (ref 35.0–45.0)
PEEP/CPAP: 5 cmH2O
PH ART: 7.24 — AB (ref 7.350–7.450)
PO2 ART: 81.3 mmHg (ref 80.0–100.0)
Patient temperature: 98.6
TCO2: 12.8 mmol/L (ref 0–100)

## 2014-01-10 LAB — BASIC METABOLIC PANEL
ANION GAP: 18 — AB (ref 5–15)
ANION GAP: 18 — AB (ref 5–15)
BUN: 20 mg/dL (ref 6–23)
BUN: 21 mg/dL (ref 6–23)
CALCIUM: 6.9 mg/dL — AB (ref 8.4–10.5)
CHLORIDE: 109 meq/L (ref 96–112)
CO2: 16 mEq/L — ABNORMAL LOW (ref 19–32)
CO2: 14 mEq/L — ABNORMAL LOW (ref 19–32)
CREATININE: 1.19 mg/dL — AB (ref 0.50–1.10)
Calcium: 7.1 mg/dL — ABNORMAL LOW (ref 8.4–10.5)
Chloride: 107 mEq/L (ref 96–112)
Creatinine, Ser: 1.25 mg/dL — ABNORMAL HIGH (ref 0.50–1.10)
GFR calc non Af Amer: 39 mL/min — ABNORMAL LOW (ref >90)
GFR calc non Af Amer: 37 mL/min — ABNORMAL LOW (ref >90)
GFR, EST AFRICAN AMERICAN: 46 mL/min — AB (ref >90)
GFR, EST AFRICAN AMERICAN: 43 mL/min — AB (ref >90)
Glucose, Bld: 230 mg/dL — ABNORMAL HIGH (ref 70–99)
Glucose, Bld: 205 mg/dL — ABNORMAL HIGH (ref 70–99)
POTASSIUM: 5.6 meq/L — AB (ref 3.7–5.3)
Potassium: 5.4 mEq/L — ABNORMAL HIGH (ref 3.7–5.3)
Sodium: 141 mEq/L (ref 137–147)
Sodium: 141 mEq/L (ref 137–147)

## 2014-01-10 LAB — PHOSPHORUS: PHOSPHORUS: 4.1 mg/dL (ref 2.3–4.6)

## 2014-01-10 LAB — MAGNESIUM: MAGNESIUM: 1.7 mg/dL (ref 1.5–2.5)

## 2014-01-10 LAB — URINE CULTURE
COLONY COUNT: NO GROWTH
Culture: NO GROWTH

## 2014-01-10 LAB — LACTIC ACID, PLASMA: Lactic Acid, Venous: 8.5 mmol/L — ABNORMAL HIGH (ref 0.5–2.2)

## 2014-01-10 LAB — MRSA PCR SCREENING: MRSA by PCR: NEGATIVE

## 2014-01-10 LAB — TROPONIN I: TROPONIN I: 0.45 ng/mL — AB (ref <0.30)

## 2014-01-10 MED ORDER — SODIUM CHLORIDE 0.9 % IV BOLUS (SEPSIS)
750.0000 mL | Freq: Once | INTRAVENOUS | Status: AC
  Administered 2014-01-10: 750 mL via INTRAVENOUS

## 2014-01-10 MED ORDER — FENTANYL CITRATE 0.05 MG/ML IJ SOLN: 50.0000 ug | Freq: Once | INTRAMUSCULAR | Status: DC

## 2014-01-10 MED ORDER — SODIUM CHLORIDE 0.9 % IV SOLN
INTRAVENOUS | Status: DC
  Administered 2014-01-10 – 2014-01-11 (×2): via INTRAVENOUS

## 2014-01-10 MED ORDER — FENTANYL BOLUS VIA INFUSION
25.0000 ug | INTRAVENOUS | Status: DC | PRN
  Filled 2014-01-10: qty 25

## 2014-01-10 MED ORDER — VANCOMYCIN 50 MG/ML ORAL SOLUTION
125.0000 mg | Freq: Four times a day (QID) | ORAL | Status: DC
  Administered 2014-01-10 – 2014-01-11 (×5): 125 mg
  Filled 2014-01-10 (×8): qty 2.5

## 2014-01-10 MED ORDER — ATORVASTATIN CALCIUM 10 MG PO TABS
10.0000 mg | ORAL_TABLET | Freq: Every day | ORAL | Status: DC
  Administered 2014-01-11: 10 mg
  Filled 2014-01-10: qty 1

## 2014-01-10 MED ORDER — FENTANYL CITRATE 0.05 MG/ML IJ SOLN
50.0000 ug | INTRAMUSCULAR | Status: DC | PRN
  Administered 2014-01-10: 50 ug via INTRAVENOUS
  Filled 2014-01-10: qty 2

## 2014-01-10 MED ORDER — FAMOTIDINE IN NACL 20-0.9 MG/50ML-% IV SOLN
20.0000 mg | Freq: Two times a day (BID) | INTRAVENOUS | Status: DC
  Administered 2014-01-10 – 2014-01-11 (×3): 20 mg via INTRAVENOUS
  Filled 2014-01-10 (×3): qty 50

## 2014-01-10 MED ORDER — FENTANYL CITRATE 0.05 MG/ML IJ SOLN
50.0000 ug | INTRAMUSCULAR | Status: AC | PRN
  Administered 2014-01-10 (×3): 50 ug via INTRAVENOUS
  Filled 2014-01-10 (×2): qty 2

## 2014-01-10 MED ORDER — PIPERACILLIN-TAZOBACTAM 3.375 G IVPB
3.3750 g | Freq: Three times a day (TID) | INTRAVENOUS | Status: DC
Start: 2014-01-10 — End: 2014-01-11
  Administered 2014-01-10 – 2014-01-11 (×4): 3.375 g via INTRAVENOUS
  Filled 2014-01-10 (×4): qty 50

## 2014-01-10 MED ORDER — NOREPINEPHRINE BITARTRATE 1 MG/ML IV SOLN
0.0000 ug/min | INTRAVENOUS | Status: DC
  Filled 2014-01-10: qty 4

## 2014-01-10 MED ORDER — CETYLPYRIDINIUM CHLORIDE 0.05 % MT LIQD
7.0000 mL | Freq: Four times a day (QID) | OROMUCOSAL | Status: DC
  Administered 2014-01-10 – 2014-01-11 (×6): 7 mL via OROMUCOSAL

## 2014-01-10 MED ORDER — FENTANYL CITRATE 0.05 MG/ML IJ SOLN
25.0000 ug/h | INTRAMUSCULAR | Status: DC
  Administered 2014-01-10 (×4): 50 ug/h via INTRAVENOUS
  Administered 2014-01-11: 25 ug/h via INTRAVENOUS
  Administered 2014-01-11: 50 ug/h via INTRAVENOUS
  Administered 2014-01-11 (×2): 25 ug/h via INTRAVENOUS
  Administered 2014-01-11: 50 ug/h via INTRAVENOUS
  Administered 2014-01-11 (×3): 25 ug/h via INTRAVENOUS
  Filled 2014-01-10 (×4): qty 50

## 2014-01-10 MED ORDER — CHLORHEXIDINE GLUCONATE 0.12 % MT SOLN
15.0000 mL | Freq: Two times a day (BID) | OROMUCOSAL | Status: DC
  Administered 2014-01-10 – 2014-01-11 (×3): 15 mL via OROMUCOSAL
  Filled 2014-01-10 (×3): qty 15

## 2014-01-10 MED FILL — Medication: Qty: 1 | Status: AC

## 2014-01-10 NOTE — Progress Notes (Signed)
Subjective: Having less diarrhea, no dyspnea  Objective: Vital signs in last 24 hours: Temp:  [97.7 F (36.5 C)-100.5 F (38.1 C)] 97.7 F (36.5 C) (12/08 0648) Pulse Rate:  [72-109] 90 (12/08 0648) Resp:  [20-26] 20 (12/08 0648) BP: (84-153)/(40-68) 104/51 mmHg (12/08 0648) SpO2:  [88 %-96 %] 96 % (12/08 0648) Weight change:    Intake/Output from previous day: 12/07 0701 - 12/08 0700 In: 1518.3 [P.O.:800; I.V.:418.3; IV Piggyback:300] Out: 1300 [Urine:1300]   General appearance: alert, cooperative and no distress Resp: clear to auscultation bilaterally Cardio: regular rate and rhythm and with a 2/6 SEM GI: soft, non-tender; bowel sounds normal; no masses,  no organomegaly Extremities: extremities normal, atraumatic, no cyanosis or edema  Lab Results:  Recent Labs  01/15/2014 2201 01/16/2014 2218 01/09/14 0359  WBC 14.7*  --  15.2*  HGB 10.5* 10.5* 10.1*  HCT 31.6* 31.0* 31.4*  PLT 242  --  221   BMET  Recent Labs  01/31/2014 2201 01/30/2014 2218 01/09/14 0359  NA 137 137 140  K 4.9 4.6 4.3  CL 99 105 106  CO2 20  --  20  GLUCOSE 124* 119* 146*  BUN 53* 48* 40*  CREATININE 2.39* 2.40* 1.85*  CALCIUM 8.5  --  7.6*   CMET CMP     Component Value Date/Time   NA 140 01/09/2014 0359   K 4.3 01/09/2014 0359   CL 106 01/09/2014 0359   CO2 20 01/09/2014 0359   GLUCOSE 146* 01/09/2014 0359   BUN 40* 01/09/2014 0359   CREATININE 1.85* 01/09/2014 0359   CALCIUM 7.6* 01/09/2014 0359   PROT 5.5* 01/09/2014 0359   ALBUMIN 2.0* 01/09/2014 0359   AST 14 01/09/2014 0359   ALT 18 01/09/2014 0359   ALKPHOS 92 01/09/2014 0359   BILITOT 0.4 01/09/2014 0359   GFRNONAA 23* 01/09/2014 0359   GFRAA 27* 01/09/2014 0359    CBG (last 3)  No results for input(s): GLUCAP in the last 72 hours.  INR RESULTS:  No results found for: INR, PROTIME   Studies/Results: Dg Chest 2 View  01/20/2014   CLINICAL DATA:  Patient lost balance and fell. Bruising to the left elbow.  Diarrhea for 3 days. Being treated for aspiration pneumonia.  EXAM: CHEST  2 VIEW  COMPARISON:  12/09/2013  FINDINGS: Since the previous study, there is improvement of right perihilar infiltration. There is residual rounded opacity in the right hilum measuring 4.6 cm. This could represent residual pneumonia but followup until resolution is recommended to exclude an underlying mass lesion. Mild cardiac enlargement with mild pulmonary vascular congestion. No blunting of costophrenic angles. No pneumothorax. Degenerative changes in the spine. Ventricular peritoneal shunt tubing is visualized.  IMPRESSION: Improving right perihilar infiltration since previous study with residual rounded opacity in the right hilum. This could represent residual pneumonia above follow-up until resolution is recommended to exclude an underlying mass lesion. Cardiac enlargement with mild vascular congestion.   Electronically Signed   By: Burman NievesWilliam  Stevens M.D.   On: 01/07/2014 23:19   Dg Elbow Complete Left  01/09/2014   CLINICAL DATA:  Patient lost balance and fell. Bruising to the left elbow.  EXAM: LEFT ELBOW - COMPLETE 3+ VIEW  COMPARISON:  None.  FINDINGS: Degenerative changes in the left elbow with hypertrophic changes present. No evidence of acute fracture or dislocation. No focal bone lesion or bone destruction. Soft tissues are unremarkable.  IMPRESSION: Degenerative changes.  No acute bony abnormalities.   Electronically Signed   By:  Burman NievesWilliam  Stevens M.D.   On: 01/10/2014 23:21   Dg Esophagus  01/09/2014   CLINICAL DATA:  Dysphagia. The patient coughs and clears her throat while eating and drinking.  EXAM: ESOPHOGRAM/BARIUM SWALLOW  TECHNIQUE: Single contrast examination was performed using  thin barium.  FLUOROSCOPY TIME:  1 min, 16 seconds  COMPARISON:  None.  FINDINGS: The patient has limited mobility and limited tip ability to turn or stand. Today' s exam was performed with patient supine. Because the limited mobility,  are ability to assess the pharyngeal phase of swallowing was very limited. However, I do suspect there may be some narrowing at the pharyngo esophageal junction based on image 14 of series 33.  After swallowing, the patient would frequently clear her throat or cough.  There is disruption of primary peristaltic waves in the mid esophagus on all swallows, with uncoordinated tertiary and some secondary contractions noted especially in the distal esophagus.  Aside from the potential narrowing at the pharyngo-esophageal junction, no upper esophageal stricture is observed.  There is an approximately 1.5 cm outpouching of contrast in the distal most esophagus suspicious for a diverticulum. This could represent a small hiatal hernia although this is considered less likely. Just below this level at the hiatus, there is abnormal narrowing not shown to be more than 5 mm in diameter. A 13 mm barium tablet impacted above this level.  IMPRESSION: 1. Suspected stricturing in the vicinity the hiatus. Although the was considerable esophageal dysmotility which sometimes inhibits are ability to distend the esophagus, I was not able to demonstrate this area opening to more than 5 mm. There is a diverticulum or small hiatal hernia just above this, and a 11 mm barium tablet impacted in this vicinity and was not observed to proceed distally over a short course of observation. 2. Frequent coughing and throat clearing after swallowing, raising the possibility of laryngeal penetration or aspiration. Correlate with prior modified barium swallow from last month. 3. Esophageal dysmotility, with disruption apparent marrow peristaltic waves in the mid esophagus on all swallows. 4. Possible narrowing/ stricture at the pharyngo esophageal junction. 5. The patient has limited mobility and limited ability to turn or stand. Esophagram normally requires the ability to stand, turn, and followup complex motor instructions. Accordingly, today's exam has  reduced sensitivity and specificity compared to a normal esophagram.   Electronically Signed   By: Herbie BaltimoreWalt  Liebkemann M.D.   On: 01/09/2014 16:13   Dg Abd 2 Views  01/15/2014   CLINICAL DATA:  Diarrhea for 3 days, aspiration pneumonia, fell today, lactic acidosis  EXAM: ABDOMEN - 2 VIEW  COMPARISON:  None  FINDINGS: Scattered stool throughout colon.  Nonobstructive bowel gas pattern.  No bowel dilatation, bowel wall thickening or free air.  VP shunt tubing in abdomen.  High attenuation material projects over the sacrum question residual Pantopaque within sacral spinal canal versus superimposed calcified nodes.  Bones appear diffusely demineralized.  Few scattered atherosclerotic calcifications.  No urinary tract calcification seen.  IMPRESSION: Nonobstructive bowel gas pattern.   Electronically Signed   By: Ulyses SouthwardMark  Boles M.D.   On: 01/07/2014 23:21    Medications: I have reviewed the patient's current medications.  Assessment/Plan: #1 Clostridium  Difficile colitis: improving with vancomycin. Will recheck CBC and CMET, narrow antibiotic spectrum, continue IVF #2 Gait instability: multifactorial. Will have PT OT evaluation to see if SNF rehab indicated #3 Esophageal Stricture: will have GI evaluate for possible EGD with dilatation  LOS: 2 days   Katrina Flynn G  01/10/2014, 8:16 AM

## 2014-01-10 NOTE — Progress Notes (Signed)
ANTIBIOTIC CONSULT NOTE - INITIAL  Pharmacy Consult for Zosyn Indication: possible aspiration PNA  No Known Allergies  Patient Measurements: Height: 5\' 6"  (167.6 cm) Weight: 135 lb 2.3 oz (61.3 kg) IBW/kg (Calculated) : 59.3  Vital Signs: Temp: 97.7 F (36.5 C) (12/08 0648) Temp Source: Axillary (12/08 0648) BP: 88/42 mmHg (12/08 1200) Pulse Rate: 117 (12/08 1200) Intake/Output from previous day: 12/07 0701 - 12/08 0700 In: 3620 [P.O.:920; I.V.:2400; IV Piggyback:300] Out: 1300 [Urine:1300] Intake/Output from this shift: Total I/O In: 1350 [I.V.:600; IV Piggyback:750] Out: 1050 [Urine:1050]  Labs:  Recent Labs  Jun 22, 2013 2201 Jun 22, 2013 2218 01/09/14 0359 01/10/14 1029  WBC 14.7*  --  15.2* 25.9*  HGB 10.5* 10.5* 10.1* 10.6*  PLT 242  --  221 183  CREATININE 2.39* 2.40* 1.85* 1.19*   Estimated Creatinine Clearance: 30 mL/min (by C-G formula based on Cr of 1.19). No results for input(s): VANCOTROUGH, VANCOPEAK, VANCORANDOM, GENTTROUGH, GENTPEAK, GENTRANDOM, TOBRATROUGH, TOBRAPEAK, TOBRARND, AMIKACINPEAK, AMIKACINTROU, AMIKACIN in the last 72 hours.   Microbiology: Recent Results (from the past 720 hour(s))  Blood culture (routine x 2)     Status: None (Preliminary result)   Collection Time: Jun 22, 2013 10:01 PM  Result Value Ref Range Status   Specimen Description BLOOD LEFT ARM  Final   Special Requests BOTTLES DRAWN AEROBIC AND ANAEROBIC 3CC  Final   Culture  Setup Time   Final    01/09/2014 01:18 Performed at [facility]    Culture   Final           BLOOD CULTURE RECEIVED NO GROWTH TO DATE CULTURE WILL BE HELD FOR 5 DAYS BEFORE ISSUING A FINAL NEGATIVE REPORT Performed at [facility]    Report Status PENDING  Incomplete  Blood culture (routine x 2)     Status: None (Preliminary result)   Collection Time: Jun 22, 2013 10:11 PM  Result Value Ref Range Status   Specimen Description BLOOD RIGHT ARM  Final   Special Requests BOTTLES DRAWN AEROBIC  AND ANAEROBIC 4CC  Final   Culture  Setup Time   Final    01/09/2014 01:18 Performed at [facility]    Culture   Final           BLOOD CULTURE RECEIVED NO GROWTH TO DATE CULTURE WILL BE HELD FOR 5 DAYS BEFORE ISSUING A FINAL NEGATIVE REPORT Performed at [facility]    Report Status PENDING  Incomplete  Urine culture     Status: None   Collection Time: Jun 22, 2013 11:50 PM  Result Value Ref Range Status   Specimen Description URINE, CATHETERIZED  Final   Special Requests NONE  Final   Culture  Setup Time   Final    01/09/2014 10:10 Performed at [facility]    Colony Count NO GROWTH Performed at [facility]   Final   Culture NO GROWTH Performed at [facility]   Final   Report Status 01/10/2014 FINAL  Final  Clostridium Difficile by PCR     Status: Abnormal   Collection Time: 01/09/14 10:02 AM  Result Value Ref Range Status   C difficile by pcr POSITIVE (A) NEGATIVE Final    Comment: CRITICAL RESULT CALLED TO, READ BACK BY AND VERIFIED WITH: C MILLAR,RN AT 1144 12 7 15  BY K BARR [facility]formed at [facility]    MRSA PCR Screening     Status: None   Collection Time: 01/10/14 10:55 AM  Result Value Ref Range Status   MRSA by  PCR NEGATIVE NEGATIVE Final    Comment:        The GeneXpert MRSA Assay (FDA approved for NASAL specimens only), is one component of a comprehensive MRSA colonization surveillance program. It is not intended to diagnose MRSA infection nor to guide or monitor treatment for MRSA infections.     Medical History: Past Medical History  Diagnosis Date  . Hypertension   . Hyperlipidemia   . Hydrocephalus 1990's  . Complication of anesthesia   . PONV (postoperative nausea and vomiting)   . Arthritis     hands    Medications:  Scheduled:  . antiseptic oral rinse  7 mL Mouth Rinse QID  . [START ON 01/23/2014] atorvastatin  10 mg Per Tube Daily  . chlorhexidine  15 mL Mouth Rinse BID  . enoxaparin  (LOVENOX) injection  30 mg Subcutaneous Q24H  . famotidine (PEPCID) IV  20 mg Intravenous Q12H  . vancomycin  125 mg Per Tube QID   Infusions:  . 0.9 % NaCl with KCl 20 mEq / L 100 mL/hr at 01/10/14 [id number]   Assessment: [age] yr female with recent hospitalization for aspiration PNA in setting of esophageal stricture and discharge to SNF, returns 12/7 with GI complaints, weakness and decreased appetite. Patient was slowly improving until 12/8 when she reportedly had some difficulty with clearing oral secretions, progressed to cardiopulmonary arrest then transferred to ICU for intubation and further care. MD initiated Flagyl and Cipro 12/7 and PO Vanc was started for positive Cdiff colitis. To transition abx's to Zosyn for recurrent aspiration PNA per pharmacy dosing.  12/7 >> Flagyl >> 12/8 12/7 >> Cipro >> 12/8 12/7 >> IV vanc >> 12/8 12/7 >> PO vanc >> 12/8 >> Zosyn >>   Afebrile  WBC 25.9, up  SCr 1.19, improved. CrCl 30 ml/min  12/6 blood: pending 12/6 urine: pending 12/7 Cdiff: positive   Goal of Therapy:  treatment of infection  Plan:  Zosyn 3.375g IV q8 (extended interval infusion) for CrCl > 20 ml/min   Hessie KnowsJustin M Javayah Magaw, PharmD, BCPS Pager [phone] 01/10/2014 12:28 PM

## 2014-01-10 NOTE — Consult Note (Signed)
PULMONARY / CRITICAL CARE MEDICINE   Name: Katrina Flynn MRN: [id number] DOB: 11-Dec-1922    ADMISSION DATE:  2014/01/29 CONSULTATION DATE:  12/8  REFERRING MD :   Jarold Motto   CHIEF COMPLAINT:   Acute cardiopulmonary arrest   INITIAL PRESENTATION:  80 year old female admitted on 12/4 w/ cdiff colitis. Was recently d/c'd to SNF on 12/2 s/p treatment for aspiration PNA in the setting of esophageal stricture. Was slowly improving until 12/8 when she reportedly had some difficulty w/ clearing oral secretions (? Refluxed/aspirated), then progressed to witnessed cardiopulmonary arrest. CPR, was started immediately and was  < 10 minutes to ROSC. Intubated and transferred to ICU.   STUDIES:    SIGNIFICANT EVENTS: 12/4 admitted for cdiff 12/8: acute cardiopulm arrest. Hypoxia mediated.    HISTORY OF PRESENT ILLNESS:   80 year old female who was admitted recently for aspiration pneumonia related to esophageal stricture and poor motility. She was discharged to [facility] where she underwent PT and returned to her usual level of independence at [facility] just 2 days prior to admit. Was admitted on 12/4 reporting had not eaten well since returning home with continued GI complaints, as well as having a fall on day of admit while putting up her clothes, which prompted her to go to the ER. Eval showed an elevated white count, a creatinine level of 2.4, when records show baseline 0.7, and low BP and generalized weakness, requiring admission. She was admitted to the medical service. Treated for presumes aspiration PNA. Labs evaluation also was positive for c-diff. She was admitted to the [facility] unit. On 12/8 on am rounds her diarrhea had improved. Later the am of 12/8 she was having increased oral secretions requiring suctioning and then later progressed to agonal respiratory pattern and PEA arrest. She underwent ACLS and was intubated, then transferred to the ICU. PCCM assumed care.   PAST MEDICAL  HISTORY :   has a past medical history of Hypertension; Hyperlipidemia; Hydrocephalus (1990's); Complication of anesthesia; PONV (postoperative nausea and vomiting); and Arthritis.  has past surgical history that includes CSF shunt; Fracture surgery; Esophagogastroduodenoscopy (N/A, 11/15/2013); and Esophagogastroduodenoscopy (N/A, 12/13/2013). Prior to Admission medications   Medication Sig Start Date End Date Taking? Authorizing Provider  atorvastatin (LIPITOR) 10 MG tablet Take 10 mg by mouth every morning.  04/13/13  Yes Historical Provider, MD  diltiazem (CARDIZEM) 60 MG tablet Take 1 tablet (60 mg total) by mouth 2 (two) times daily. 12/14/13  Yes Jarome Matin, MD  furosemide (LASIX) 40 MG tablet Take 1 tablet (40 mg total) by mouth daily. 12/14/13  Yes Jarome Matin, MD  loperamide (IMODIUM) 2 MG capsule Take 2 mg by mouth daily as needed for diarrhea or loose stools.   Yes Historical Provider, MD  losartan (COZAAR) 100 MG tablet Take 100 mg by mouth every morning.  04/13/13  Yes Historical Provider, MD  Multiple Vitamin (MULTIVITAMIN WITH MINERALS) TABS tablet Take 1 tablet by mouth every morning.    Yes Historical Provider, MD  Multiple Vitamins-Minerals (ICAPS MV PO) Take 1 tablet by mouth every morning.    Yes Historical Provider, MD  Omega-3 Fatty Acids (FISH OIL PO) Take 1,000 mg by mouth daily.   Yes Historical Provider, MD  pantoprazole (PROTONIX) 40 MG tablet Take 40 mg by mouth 2 (two) times daily.   Yes Historical Provider, MD  Probiotic Product (ALIGN) 4 MG CAPS Take 4 mg by mouth daily.   Yes Historical Provider, MD  spironolactone (ALDACTONE) 25 MG  tablet Take 1 tablet (25 mg total) by mouth daily. 12/14/13  Yes Jarome Matin, MD  ALPRAZolam Prudy Feeler) 0.5 MG tablet Take 0.5 mg by mouth at bedtime as needed for anxiety.    Historical Provider, MD   No Known Allergies  FAMILY HISTORY:  has no family status information on file.  SOCIAL HISTORY:  reports that she has never  smoked. She has never used smokeless tobacco. She reports that she does not drink alcohol or use illicit drugs.  REVIEW OF SYSTEMS:   unable  SUBJECTIVE:  Sedated on vent  VITAL SIGNS: Temp:  [97.7 F (36.5 C)-100.5 F (38.1 C)] 97.7 F (36.5 C) (12/08 0648) Pulse Rate:  [72-131] 117 (12/08 1200) Resp:  [20-30] 29 (12/08 1200) BP: (88-153)/(42-67) 88/42 mmHg (12/08 1200) SpO2:  [88 %-100 %] 98 % (12/08 1200) FiO2 (%):  [100 %] 100 % (12/08 1049) HEMODYNAMICS:   VENTILATOR SETTINGS: Vent Mode:  [-] PRVC FiO2 (%):  [100 %] 100 % Set Rate:  [14 bmp] 14 bmp Vt Set:  [530 mL] 530 mL PEEP:  [5 cmH20] 5 cmH20 Plateau Pressure:  [27 cmH20] 27 cmH20 INTAKE / OUTPUT:  Intake/Output Summary (Last 24 hours) at 01/10/14 1219 Last data filed at 01/10/14 1200  Gross per 24 hour  Intake 3451.67 ml  Output   1350 ml  Net 2101.67 ml    PHYSICAL EXAMINATION: General:  Elderly frail 80 year old female, sedated on vent  Neuro:  Unresponsive s/p arrest. PERRL, localizes  HEENT:  Orally intubated  Cardiovascular:  irreg irreg tachy AF Lungs: + accessory muscle use, scattered rhonchi, exp wheeze  Abdomen:  Distended, hypoactive  Musculoskeletal:  Intact  Skin:  Scattered areas of ecchymosis   LABS:  CBC  Recent Labs Lab 01/18/2014 2201 01/09/2014 2218 01/09/14 0359 01/10/14 1029  WBC 14.7*  --  15.2* 25.9*  HGB 10.5* 10.5* 10.1* 10.6*  HCT 31.6* 31.0* 31.4* 32.8*  PLT 242  --  221 183   Coag's No results for input(s): APTT, INR in the last 168 hours. BMET  Recent Labs Lab 01/12/2014 2201 01/07/2014 2218 01/09/14 0359 01/10/14 1029  NA 137 137 140 141  K 4.9 4.6 4.3 5.6*  CL 99 105 106 107  CO2 20  --  20 16*  BUN 53* 48* 40* 20  CREATININE 2.39* 2.40* 1.85* 1.19*  GLUCOSE 124* 119* 146* 230*   Electrolytes  Recent Labs Lab 01/26/2014 2201 01/09/14 0359 01/10/14 1029  CALCIUM 8.5 7.6* 7.1*  MG  --   --  1.7  PHOS  --   --  4.1   Sepsis Markers  Recent  Labs Lab 01/03/2014 2220 01/10/14 1029  LATICACIDVEN 2.00 8.5*   ABG  Recent Labs Lab 01/10/14 1205  PHART 7.240*  PCO2ART 32.4*  PO2ART 81.3   Liver Enzymes  Recent Labs Lab 01/10/2014 2201 01/09/14 0359  AST 19 14  ALT 22 18  ALKPHOS 108 92  BILITOT 0.3 0.4  ALBUMIN 2.3* 2.0*   Cardiac Enzymes  Recent Labs Lab 01/10/14 1029  TROPONINI 0.45*   Glucose No results for input(s): GLUCAP in the last 168 hours.  Imaging Dg Esophagus  01/09/2014   CLINICAL DATA:  Dysphagia. The patient coughs and clears her throat while eating and drinking.  EXAM: ESOPHOGRAM/BARIUM SWALLOW  TECHNIQUE: Single contrast examination was performed using  thin barium.  FLUOROSCOPY TIME:  1 min, 16 seconds  COMPARISON:  None.  FINDINGS: The patient has limited mobility and limited tip ability  to turn or stand. Today' s exam was performed with patient supine. Because the limited mobility, are ability to assess the pharyngeal phase of swallowing was very limited. However, I do suspect there may be some narrowing at the pharyngo esophageal junction based on image 14 of series 33.  After swallowing, the patient would frequently clear her throat or cough.  There is disruption of primary peristaltic waves in the mid esophagus on all swallows, with uncoordinated tertiary and some secondary contractions noted especially in the distal esophagus.  Aside from the potential narrowing at the pharyngo-esophageal junction, no upper esophageal stricture is observed.  There is an approximately 1.5 cm outpouching of contrast in the distal most esophagus suspicious for a diverticulum. This could represent a small hiatal hernia although this is considered less likely. Just below this level at the hiatus, there is abnormal narrowing not shown to be more than 5 mm in diameter. A 13 mm barium tablet impacted above this level.  IMPRESSION: 1. Suspected stricturing in the vicinity the hiatus. Although the was considerable esophageal  dysmotility which sometimes inhibits are ability to distend the esophagus, I was not able to demonstrate this area opening to more than 5 mm. There is a diverticulum or small hiatal hernia just above this, and a 11 mm barium tablet impacted in this vicinity and was not observed to proceed distally over a short course of observation. 2. Frequent coughing and throat clearing after swallowing, raising the possibility of laryngeal penetration or aspiration. Correlate with prior modified barium swallow from last month. 3. Esophageal dysmotility, with disruption apparent marrow peristaltic waves in the mid esophagus on all swallows. 4. Possible narrowing/ stricture at the pharyngo esophageal junction. 5. The patient has limited mobility and limited ability to turn or stand. Esophagram normally requires the ability to stand, turn, and followup complex motor instructions. Accordingly, today's exam has reduced sensitivity and specificity compared to a normal esophagram.   Electronically Signed   By: Herbie BaltimoreWalt  Liebkemann M.D.   On: 01/09/2014 16:13     ASSESSMENT / PLAN:  PULMONARY OETT 12/8>>> A: acute respiratory failure      Recurrent Aspiration pnaP:  Full vent support  F/u abg Sputum culture  PAD protocol   CARDIOVASCULAR CVL A:  Severe sepsis  AF  S/p PEA arrest  P:  Ck lactic acid Tele   RENAL A:   AKI: scr improved  Hyperkalemia (? Lab error) P:   Hold antihypertensives Cont IVFs Repeat K  Trend chemistry  Renal dose meds   GASTROINTESTINAL A:   cdiff colitis  P:   NPO PPI for SUP   HEMATOLOGIC A:   Anemia of chronic disease.  No evidence of active bleeding.  P:  Trend CBC [place] heparin   INFECTIOUS A:   c diff colitis  Pneumonia (HCAP vs aspiration) P:   BCx2  12/6>>> UC 12/6: neg resp culture 12/8>>> cdiff 12/6: positive  vanc 12/7>>> Flagyl 12/7>>> Zosyn 12/8>>>  ENDOCRINE A:   Hyperglycemia  P:   ssi   NEUROLOGIC A:   Acute encephalopathy P:   RASS  goal: -2 PAD protocol -int fent, can progress to fent gtt if needed   FAMILY  - Updates: 12/8, son Katrina Flynn   - Inter-disciplinary family meet or Palliative Care meeting due ZO:XWRUby:plan of care and goals of care discussed. She is DNR  Simonne MartinetPeter E Babcock ACNP-BC [facility] Pulmonary/Critical Care Pager # [phone] OR # [phone] if no answer      ATTENDING NOTE: I  have personally reviewed patient's available data, including medical history, events of note, physical examination and test results as part of my evaluation. I have discussed with resident/NP and other careteam providers such as pharmacist, RN and RRT & co-ordinated with consultants. In addition, I personally evaluated patient and elicited key history of esophageal stricture,pEA arrest, exam findings of unresponsive, resp distress post intubation, BL scattered crackles & labs showing metab acidosis -lactate & leucocytosis CXR confirms worsening pneumonia - likely from aspiration   TODAY'S MD SUMMARY: Aspiration pneumonia with acute resp failure post arrest -DNR, short term intubation only, comfort care if worse Rest per NP/medical resident whose note is outlined above and that I agree with and edited in full.     The patient is critically ill with multiple organ systems failure and requires high complexity decision making for assessment and support, frequent evaluation and titration of therapies, application of advanced monitoring technologies and extensive interpretation of multiple databases. Critical Care Time devoted to patient care services described in this note independent of APP time is 50 minutes.   Oretha MilchALVA,Adilynn Bessey V. MD  01/10/2014, 12:19 PM

## 2014-01-10 NOTE — Progress Notes (Signed)
Responded to Code Blue. Waited for son to arrive. Patient transferred to ICU, met Marya Amsler there.  Son is not only her caregiver but is also caregiver for his father at [facility] with Alzheimer's.  He has two other brothers who do not live locally. One is in [place] and one in [place].  Provided support, listened empathetically as he processed his caregiving. Facilitated communication between him and staff working with patient.   Epifania Gore, PhD, [place]

## 2014-01-10 NOTE — Progress Notes (Signed)
Went in to patient's room at 10:10. Patient sounded congested and was coughing up secretions. I used the yonker suction on patient. Patient aspirated and became unresponsive at 10:15. Code blue called and response team in room by 10:16.   Patient transferred to ICU. I gave report to Tia RN and explained to patient's son Katrina SoursGreg what had happened.

## 2014-01-10 NOTE — Progress Notes (Signed)
Long discussion w/ pt's Son Katrina Flynn. She previously had established goals of care and had requested DNR status. The pt's sons do not want prolonged aggressive care. They are OK with maintaining current mechanical ventilation for very short period (as she is already intubated), but they do not want invasive interventions. The plan of care at this point will be: cont current vent settings, cont IV hydration and add empiric antibiotics for aspiration. We will not add vaso-active gtts, we will not place central line, we have adjusted her code status to DNR per discussion with her family. Hope that we may be able to wean her, but she appears to have had a significant aspiration event and I am concerned that she could deteriorate over night. Have explained this to her son Katrina Flynn. His biggest concern is for her dignity. If things worsen over night we may need to discuss transitioning to palliation.   Katrina MartinetPeter E Flynn ACNP-BC [facility]/Critical [facility] Pager # [phone] OR # [phone] if no answer

## 2014-01-10 NOTE — Progress Notes (Signed)
OT Cancellation Note  Patient Details Name: Lezlie Stoklosa MRN: [id number] DOB: Apr 06, 1922   Cancelled Treatment:    Reason Eval/Treat Not Completed: Medical issues which prohibited therapy. Code Blue called and pt transferred to ICU. Will hold for now.  Lennox LaityStone, Nusaiba Guallpa Stafford  [phone] 01/10/2014, 12:07 PM

## 2014-01-10 NOTE — Progress Notes (Signed)
CODE BLUE called: 10:15, pt in respiraotry arrest. Increased secretion noted prior to code blue, nurse helped with suctioning secretions and pt suddenly stopped breathing. Code blue called, pt in PEA upon my arrival. Compressions started. Pt given atropine x 1 dose, also given Epi x 3 doses, bicarb x 2 doses. Pulse obtained at 10:23. HR in 120's, BP 130/76 mmHg. Blood tests ordered: BMP, Mg, Phos, lactic acid, CE's. CXR requested. Pt transferred to ICU.   Debbora PrestoMAGICK-Alva Broxson, MD  [facility] Pager [phone]  If 7PM-7AM, please contact night-coverage [email or web address] Password [email or web address]

## 2014-01-10 NOTE — Progress Notes (Signed)
PT Cancellation Note  Patient Details Name: Katrina Flynn MRN: [id number] DOB: Aug 28, 1922   Cancelled Treatment:    Reason Eval/Treat Not Completed: Medical issues which prohibited therapy (code blue called and pt transferred to ICU)   [name],[name] 01/10/2014, 11:56 AM Zenovia JarredKati Nicholes Hibler, PT, DPT 01/10/2014 Pager: [phone]

## 2014-01-11 ENCOUNTER — Inpatient Hospital Stay (HOSPITAL_COMMUNITY): Payer: Medicare Other

## 2014-01-11 ENCOUNTER — Ambulatory Visit: Payer: Medicare Other | Admitting: Internal Medicine

## 2014-01-11 DIAGNOSIS — E43 Unspecified severe protein-calorie malnutrition: Secondary | ICD-10-CM

## 2014-01-11 LAB — CBC
HCT: 30.3 % — ABNORMAL LOW (ref 36.0–46.0)
HCT: 31.2 % — ABNORMAL LOW (ref 36.0–46.0)
HEMOGLOBIN: 10 g/dL — AB (ref 12.0–15.0)
Hemoglobin: 10.2 g/dL — ABNORMAL LOW (ref 12.0–15.0)
MCH: 31.2 pg (ref 26.0–34.0)
MCH: 31.2 pg (ref 26.0–34.0)
MCHC: 33 g/dL (ref 30.0–36.0)
MCHC: 32.7 g/dL (ref 30.0–36.0)
MCV: 94.4 fL (ref 78.0–100.0)
MCV: 95.4 fL (ref 78.0–100.0)
PLATELETS: 194 10*3/uL (ref 150–400)
Platelets: 203 10*3/uL (ref 150–400)
RBC: 3.21 MIL/uL — AB (ref 3.87–5.11)
RBC: 3.27 MIL/uL — AB (ref 3.87–5.11)
RDW: 14.8 % (ref 11.5–15.5)
RDW: 14.8 % (ref 11.5–15.5)
WBC: 25.9 10*3/uL — AB (ref 4.0–10.5)
WBC: 25.1 10*3/uL — AB (ref 4.0–10.5)

## 2014-01-11 LAB — COMPREHENSIVE METABOLIC PANEL
ALT: 228 U/L — ABNORMAL HIGH (ref 0–35)
ANION GAP: 12 (ref 5–15)
AST: 268 U/L — AB (ref 0–37)
Albumin: 1.7 g/dL — ABNORMAL LOW (ref 3.5–5.2)
Alkaline Phosphatase: 111 U/L (ref 39–117)
BILIRUBIN TOTAL: 0.4 mg/dL (ref 0.3–1.2)
BUN: 24 mg/dL — ABNORMAL HIGH (ref 6–23)
CALCIUM: 6.9 mg/dL — AB (ref 8.4–10.5)
CO2: 18 meq/L — AB (ref 19–32)
CREATININE: 1.31 mg/dL — AB (ref 0.50–1.10)
Chloride: 111 mEq/L (ref 96–112)
GFR calc Af Amer: 41 mL/min — ABNORMAL LOW (ref >90)
GFR, EST NON AFRICAN AMERICAN: 35 mL/min — AB (ref >90)
Glucose, Bld: 152 mg/dL — ABNORMAL HIGH (ref 70–99)
Potassium: 5.2 mEq/L (ref 3.7–5.3)
Sodium: 141 mEq/L (ref 137–147)
Total Protein: 5.1 g/dL — ABNORMAL LOW (ref 6.0–8.3)

## 2014-01-12 LAB — CULTURE, RESPIRATORY W GRAM STAIN

## 2014-01-12 LAB — CULTURE, RESPIRATORY: CULTURE: NO GROWTH

## 2014-01-15 LAB — CULTURE, BLOOD (ROUTINE X 2)
CULTURE: NO GROWTH
Culture: NO GROWTH

## 2014-01-16 NOTE — Discharge Summary (Signed)
PULMONARY / CRITICAL CARE MEDICINE   Name: Katrina Flynn MRN: [id number] DOB: [birth year]    ADMISSION DATE:  01/15/2014 CONSULTATION DATE:  12/8  REFERRING MD :   Jarold MottoPatterson   CHIEF COMPLAINT:   Acute cardiopulmonary arrest   INITIAL PRESENTATION:  [age] year old female admitted on 12/4 w/ cdiff colitis. Was recently d/c'd to SNF on 12/2 s/p treatment for aspiration PNA in the setting of esophageal stricture. Was slowly improving until 12/8 when she reportedly had some difficulty w/ clearing oral secretions (? Refluxed/aspirated), then progressed to witnessed cardiopulmonary arrest. CPR, was started immediately and was  < 10 minutes to ROSC. Intubated and transferred to ICU.   STUDIES:    SIGNIFICANT EVENTS: 12/4 admitted for cdiff 12/8: acute cardiopulm arrest. Hypoxia mediated. Made DNR w/ no aggressive measures.   Imaging No results found.CXR w/ diffuse bilateral airspace disease. Some improvement in RUL  ASSESSMENT / PLAN:  PULMONARY OETT 12/8>>> A: acute respiratory failure      Recurrent Aspiration pna       ARDS P:  Full vent support    CARDIOVASCULAR CVL A:  Severe sepsis  AF  S/p PEA arrest  P:  Full DNR   RENAL A:   AKI: scr stable  Hyperkalemia  -->improved P:     GASTROINTESTINAL A:   cdiff colitis  Elevated LFTs-->suspect shock liver s/p arrest  P:      INFECTIOUS A:   c diff colitis  Pneumonia (HCAP vs aspiration)    NEUROLOGIC A:   Acute encephalopathy Not following commands. Concerned about HIE  P:   RASS goal: -2    FAMILY  - Updates: 12/8, son Tammy SoursGreg    DNR issued  Course: Massive aspiration w/ ARDS. S/p PEA arrest. Concerned about HIE. She was  extubated  to comfort once family has visited . Discussed with sons  Cause of death - ARDS, aspiration pneumonia, c diff colitis, esophageal stricture, post cardiac arrest  Oretha MilchALVA,Corry Storie V. MD  01/16/2014, 5:46 PM

## 2014-02-03 NOTE — Progress Notes (Signed)
Maxed out on fentanyl at 400 mcg. Patient gurgling with her breathing. Raised head of bed. Patient does not appear to be in distress. Called the box and spoke with nurse who is covering patient and asked if they would camera in and see if patient appeared distressed or if should order anything else for comfort. RN in box stated patient appears fine, family seems content and to continue to monitor. Will call back if patient's status changes.

## 2014-02-03 NOTE — Progress Notes (Signed)
OT Cancellation Note  Patient Details Name: Katrina Flynn MRN: [id number] DOB: [birth year]   Cancelled Treatment:    Reason Eval/Treat Not Completed: Other (comment).  Noted pt is on vent and comfort care is anticipated.  If status changes, and pt will benefit from OT, please reorder.  I am signing off at this time.  [name] 02/02/2014, 9:38 AM  Marica OtterMaryellen Corin Tilly, OTR/L [phone] 02/02/2014

## 2014-02-03 NOTE — Progress Notes (Signed)
Cindee LamePete, NP currently speaking with sons at bedside about plans of care.

## 2014-02-03 NOTE — Progress Notes (Signed)
Nutrition Brief Note  Chart reviewed. Pt transitioning to comfort care.  No further nutrition interventions warranted at this time.  Please consult as needed.    Lloyd HugerSarah F Chastin Riesgo MS RD LDN Clinical Dietitian Pager:[phone]

## 2014-02-03 NOTE — Progress Notes (Signed)
Patient has passed. Wasted 240cc of Fentanyl with Judeth CornfieldStephanie, Charity fundraiserN. Stephanie and self ausculted for 1 full minute. MD in box aware.

## 2014-02-03 NOTE — Progress Notes (Signed)
Sons at bedside and have requested to call Cindee LamePete, NP and begin withdrawal of care orders.

## 2014-02-03 NOTE — Progress Notes (Signed)
I appreciate the care rendered by the CCM and pastoral services. Overnight her blood pressure has improved although her CNS status is questionable. Her son, Katrina Flynn, has opted to continue ventilator support for now without use of pressor agents. I will see if the CCM service would like to assume attending status for now, and I will follow along and help with family communication.

## 2014-02-03 NOTE — Progress Notes (Signed)
PULMONARY / CRITICAL CARE MEDICINE   Name: Katrina Flynn MRN: [id number] DOB: 1922-02-13    ADMISSION DATE:  02/01/14 CONSULTATION DATE:  12/8  REFERRING MD :   Jarold Motto   CHIEF COMPLAINT:   Acute cardiopulmonary arrest   INITIAL PRESENTATION:  81 year old female admitted on 12/4 w/ cdiff colitis. Was recently d/c'd to SNF on 12/2 s/p treatment for aspiration PNA in the setting of esophageal stricture. Was slowly improving until 12/8 when she reportedly had some difficulty w/ clearing oral secretions (? Refluxed/aspirated), then progressed to witnessed cardiopulmonary arrest. CPR, was started immediately and was  < 10 minutes to ROSC. Intubated and transferred to ICU.   STUDIES:    SIGNIFICANT EVENTS: 12/4 admitted for cdiff 12/8: acute cardiopulm arrest. Hypoxia mediated. Made DNR w/ no aggressive measures.    SUBJECTIVE:  Sedated on vent  VITAL SIGNS: Temp:  [97.6 F (36.4 C)-100.3 F (37.9 C)] 98.2 F (36.8 C) (12/09 0734) Pulse Rate:  [80-131] 106 (12/09 0830) Resp:  [12-34] 16 (12/09 0830) BP: (48-126)/(16-94) 88/39 mmHg (12/09 0800) SpO2:  [89 %-100 %] 89 % (12/09 0830) FiO2 (%):  [50 %-100 %] 50 % (12/09 0830) Weight:  [69.2 kg (152 lb 8.9 oz)] 69.2 kg (152 lb 8.9 oz) (12/09 0500) HEMODYNAMICS:   VENTILATOR SETTINGS: Vent Mode:  [-] PRVC FiO2 (%):  [50 %-100 %] 50 % Set Rate:  [14 bmp] 14 bmp Vt Set:  [530 mL] 530 mL PEEP:  [5 cmH20] 5 cmH20 Plateau Pressure:  [12 cmH20-27 cmH20] 12 cmH20 INTAKE / OUTPUT:  Intake/Output Summary (Last 24 hours) at 01/07/2014 0846 Last data filed at 01/18/2014 0800  Gross per 24 hour  Intake 3689.35 ml  Output   1447 ml  Net 2242.35 ml    PHYSICAL EXAMINATION: General:  Elderly frail 81 year old female, sedated on vent  Neuro:  Unresponsive s/p arrest. PERRL, localizes  HEENT:  Orally intubated  Cardiovascular:  irreg irreg tachy AF Lungs: scattered rhonchi + accessory muscle use  Abdomen:  Distended, hypoactive   Musculoskeletal:  Intact  Skin:  Scattered areas of ecchymosis   LABS:  CBC  Recent Labs Lab 01/10/14 1029 01/22/2014 0024 01/31/2014 0334  WBC 25.9* 25.9* 25.1*  HGB 10.6* 10.0* 10.2*  HCT 32.8* 30.3* 31.2*  PLT 183 203 194   Coag's No results for input(s): APTT, INR in the last 168 hours. BMET  Recent Labs Lab 01/10/14 1029 01/10/14 1255 01/25/2014 0334  NA 141 141 141  K 5.6* 5.4* 5.2  CL 107 109 111  CO2 16* 14* 18*  BUN 20 21 24*  CREATININE 1.19* 1.25* 1.31*  GLUCOSE 230* 205* 152*   Electrolytes  Recent Labs Lab 01/10/14 1029 01/10/14 1255 01/12/2014 0334  CALCIUM 7.1* 6.9* 6.9*  MG 1.7  --   --   PHOS 4.1  --   --    Sepsis Markers  Recent Labs Lab 02/01/2014 2220 01/10/14 1029  LATICACIDVEN 2.00 8.5*   ABG  Recent Labs Lab 01/10/14 1205  PHART 7.240*  PCO2ART 32.4*  PO2ART 81.3   Liver Enzymes  Recent Labs Lab 01-Feb-2014 2201 01/09/14 0359 01/21/2014 0334  AST 19 14 268*  ALT 22 18 228*  ALKPHOS 108 92 111  BILITOT 0.3 0.4 0.4  ALBUMIN 2.3* 2.0* 1.7*   Cardiac Enzymes  Recent Labs Lab 01/10/14 1029  TROPONINI 0.45*   Glucose No results for input(s): GLUCAP in the last 168 hours.  Imaging Dg Chest [facility] 1 View  01/10/2014  CLINICAL DATA:  81 year old female with respiratory distress. Initial encounter.  EXAM: PORTABLE CHEST - 1 VIEW  COMPARISON:  03-May-2013 and earlier.  FINDINGS: Portable AP supine view at 1054 hrs. Endotracheal tube now in place, tip 18 mm above the carina. Resuscitation pads in place. Stable shunt tubing projecting over the chest.  Severe worsening of upper lobe predominant airspace opacity, severe on the right. Stable cardiac size and mediastinal contours. No definite pneumothorax. Small pleural effusion suspected.  IMPRESSION: 1. Endotracheal tube tip 18 mm above the carina. 2. Severe right greater than left lung opacity new since 03-May-2013. Consider severe aspiration and bilateral pneumonia. 3. Small pleural  effusions suspected.   Electronically Signed   By: Augusto GambleLee  Hall M.D.   On: 01/10/2014 11:20   Dg Abd Portable 1v  01/10/2014   CLINICAL DATA:  Abdominal pain and distension  EXAM: PORTABLE ABDOMEN - 1 VIEW  COMPARISON:  None.  FINDINGS: Orogastric tube placement with the tip projecting over the body of the stomach. VP shunt catheter tubing noted along the right side of the abdomen. There is no bowel dilatation to suggest obstruction. There is no evidence of pneumoperitoneum, portal venous gas or pneumatosis. There are no pathologic calcifications along the expected course of the ureters.Lumbar spine spondylosis. Degenerative changes of bilateral SI joints.  IMPRESSION: Orogastric tube with the tip projecting over the body of the stomach.   Electronically Signed   By: Elige KoHetal  Patel   On: 01/10/2014 16:19  CXR w/ diffuse bilateral airspace disease. Some improvement in RUL  ASSESSMENT / PLAN:  PULMONARY OETT 12/8>>> A: acute respiratory failure      Recurrent Aspiration pna       ARDS P:  Full vent support  PAD protocol  Anticipate 1 way extubation   CARDIOVASCULAR CVL A:  Severe sepsis  AF  S/p PEA arrest  P:  Tele  Full DNR   RENAL A:   AKI: scr stable  Hyperkalemia  -->improved P:   Hold antihypertensives Cont IVFs Trend chemistry  Renal dose meds   GASTROINTESTINAL A:   cdiff colitis  Elevated LFTs-->suspect shock liver s/p arrest  P:   NPO PPI for SUP   HEMATOLOGIC A:   Anemia of chronic disease.  No evidence of active bleeding.  P:  Trend CBC [place] heparin   INFECTIOUS A:   c diff colitis  Pneumonia (HCAP vs aspiration) P:   BCx2  12/6>>> UC 12/6: neg resp culture 12/8>>> cdiff 12/6: positive  vanc 12/7>>> Flagyl 12/7>>> Zosyn 12/8>>>  ENDOCRINE A:   Hyperglycemia  P:   ssi   NEUROLOGIC A:   Acute encephalopathy Not following commands. Concerned about HIE  P:   RASS goal: -2 Will likely transition to comfort care    FAMILY  - Updates:  12/8, son Rocky CraftsGreg   - Inter-disciplinary family meet or Palliative Care meeting due WU:JWJXby:plan of care and goals of care discussed. She is DNR   Simonne MartinetPeter E Babcock ACNP-BC [facility] Pul[facility]/Critical Care Pager # [phone] OR # [phone] if no answer   ATTENDING NOTE: I have personally reviewed patient's available data, including medical history, events of note, physical examination and test results as part of my evaluation. I have discussed with resident/NP and other careteam providers such as pharmacist, RN and RRT & co-ordinated with consultants. In addition, I personally evaluated patient and elicited key history of dementia, aspiration episode, exam findings of high FIo2 requirements, BL scattered crackles, on fent gtt  & labs showing leucocytosis, elevated  liver enzymes c/w shock liver & AKI.  TODAY'S MD SUMMARY: Massive aspiration w/ ARDS. S/p PEA arrest. Concerned about HIE. Plan for one-way extubation  to comfort once family has visited . Discussed with sons  Rest per NP/medical resident whose note is outlined above and that I agree with and edited in full.   The patient is critically ill with multiple organ systems failure and requires high complexity decision making for assessment and support, frequent evaluation and titration of therapies, application of advanced monitoring technologies and extensive interpretation of multiple databases. Critical Care Time devoted to patient care services described in this note independent of APP time is 35 minutes.    Oretha MilchALVA,RAKESH V. MD  01/29/2014, 8:46 AM

## 2014-02-03 NOTE — Progress Notes (Signed)
Physical Therapy Discharge Patient Details Name: Katrina Flynn MRN: [id number] DOB: Feb 14, 1922 Today's Date: 01/17/2014 Time:  -     Patient discharged from PT services secondary to medical decline - will need to re-order PT to resume therapy services.chart reviewed. Currently on vent, to be wEANED AS ABLE,  Now DNR.   GP     Sharen HeckHill, Kenna Seward [name] PT [phone]  01/25/2014, 8:17 AM

## 2014-02-03 NOTE — Progress Notes (Signed)
Patient extubated and nasal cannula placed. Family and self at bedside.

## 2014-02-03 DEATH — deceased

## 2014-02-20 ENCOUNTER — Ambulatory Visit: Payer: Medicare Other | Admitting: Gastroenterology

## 2016-05-29 IMAGING — CR DG CHEST 2V
2 series · 2 of 2 positions shown · non-contrast
Comparison: 12/06/2013

CLINICAL DATA: Productive cough, nausea and vomiting, and recent
pneumonia.

EXAM:
CHEST  2 VIEW

[w chest lat]
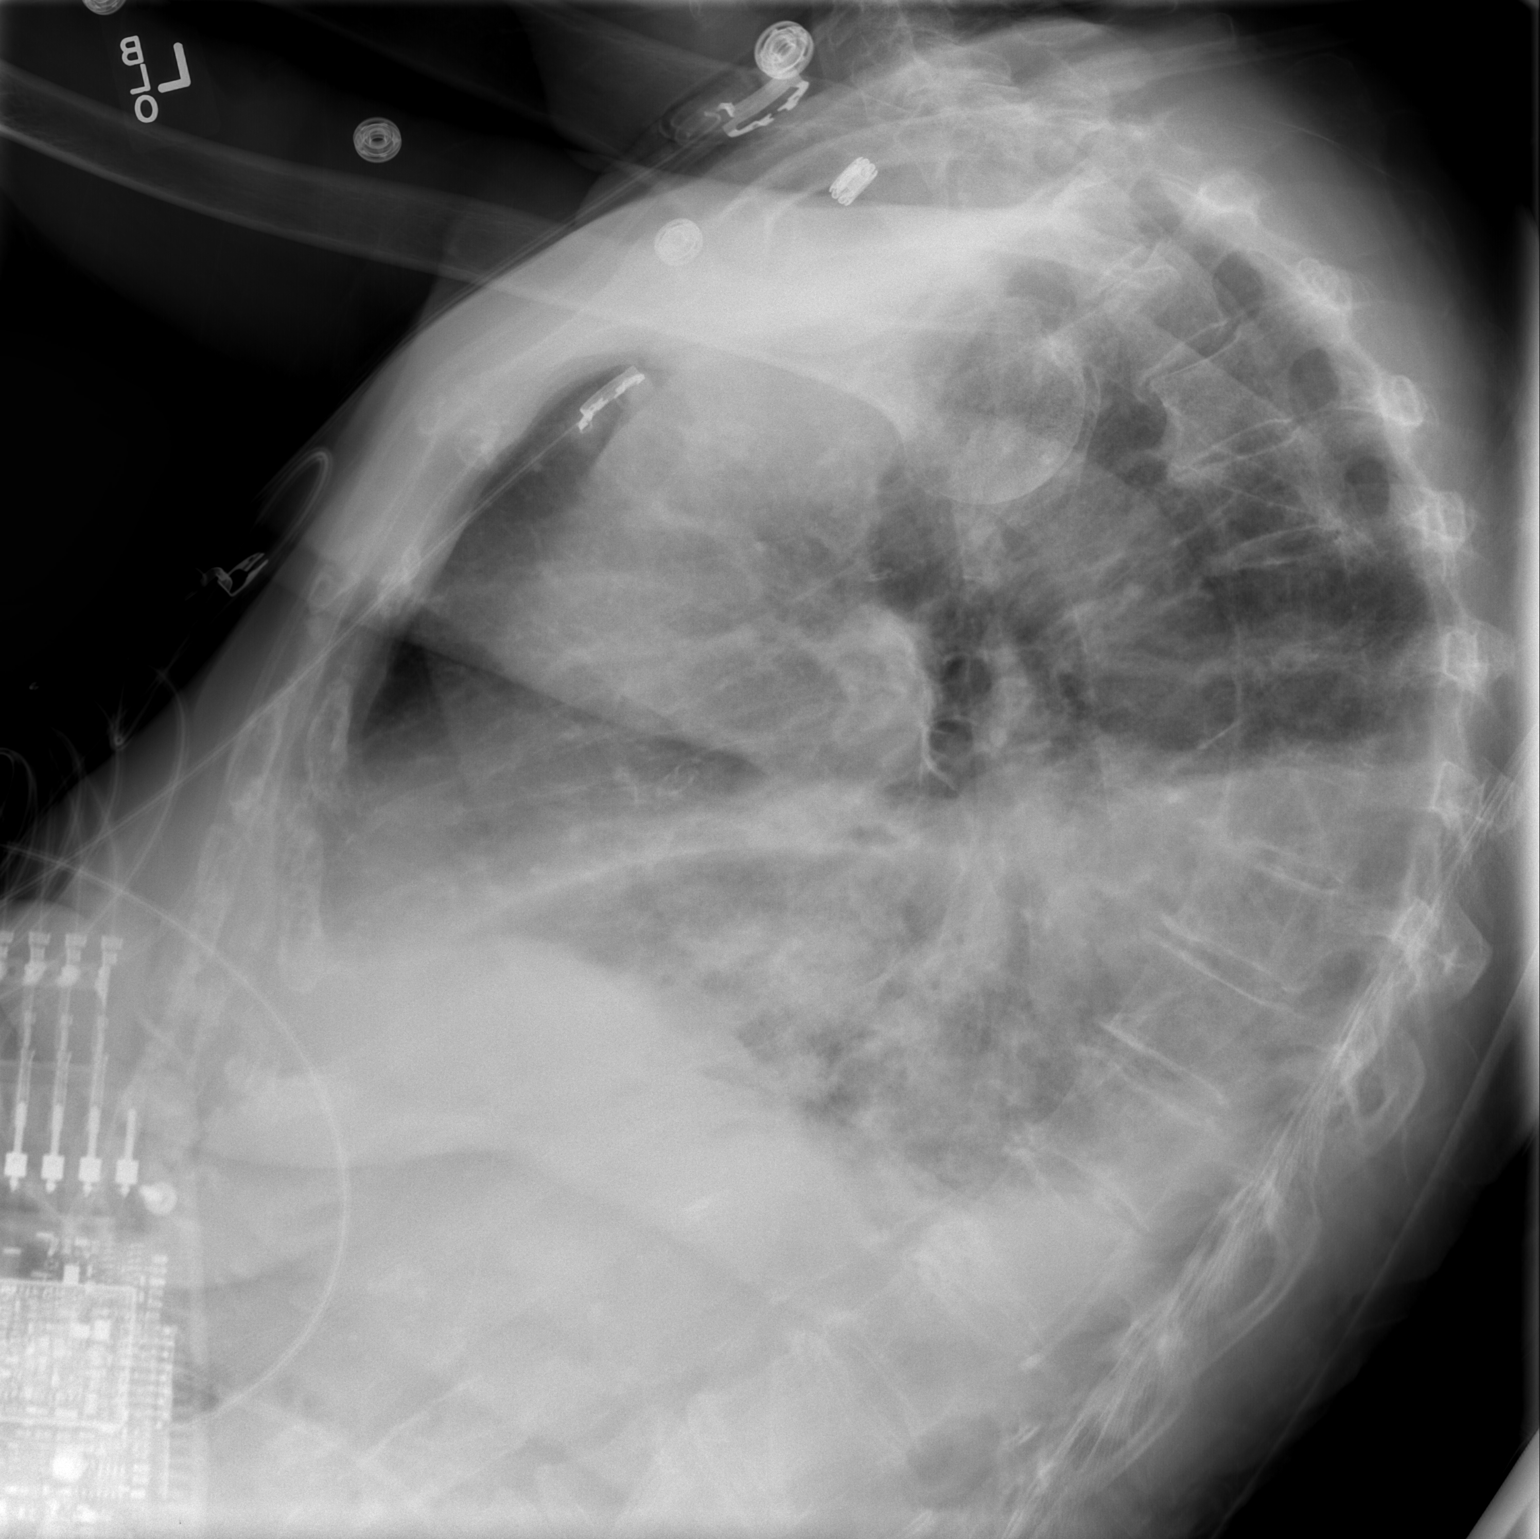

[view not recorded]
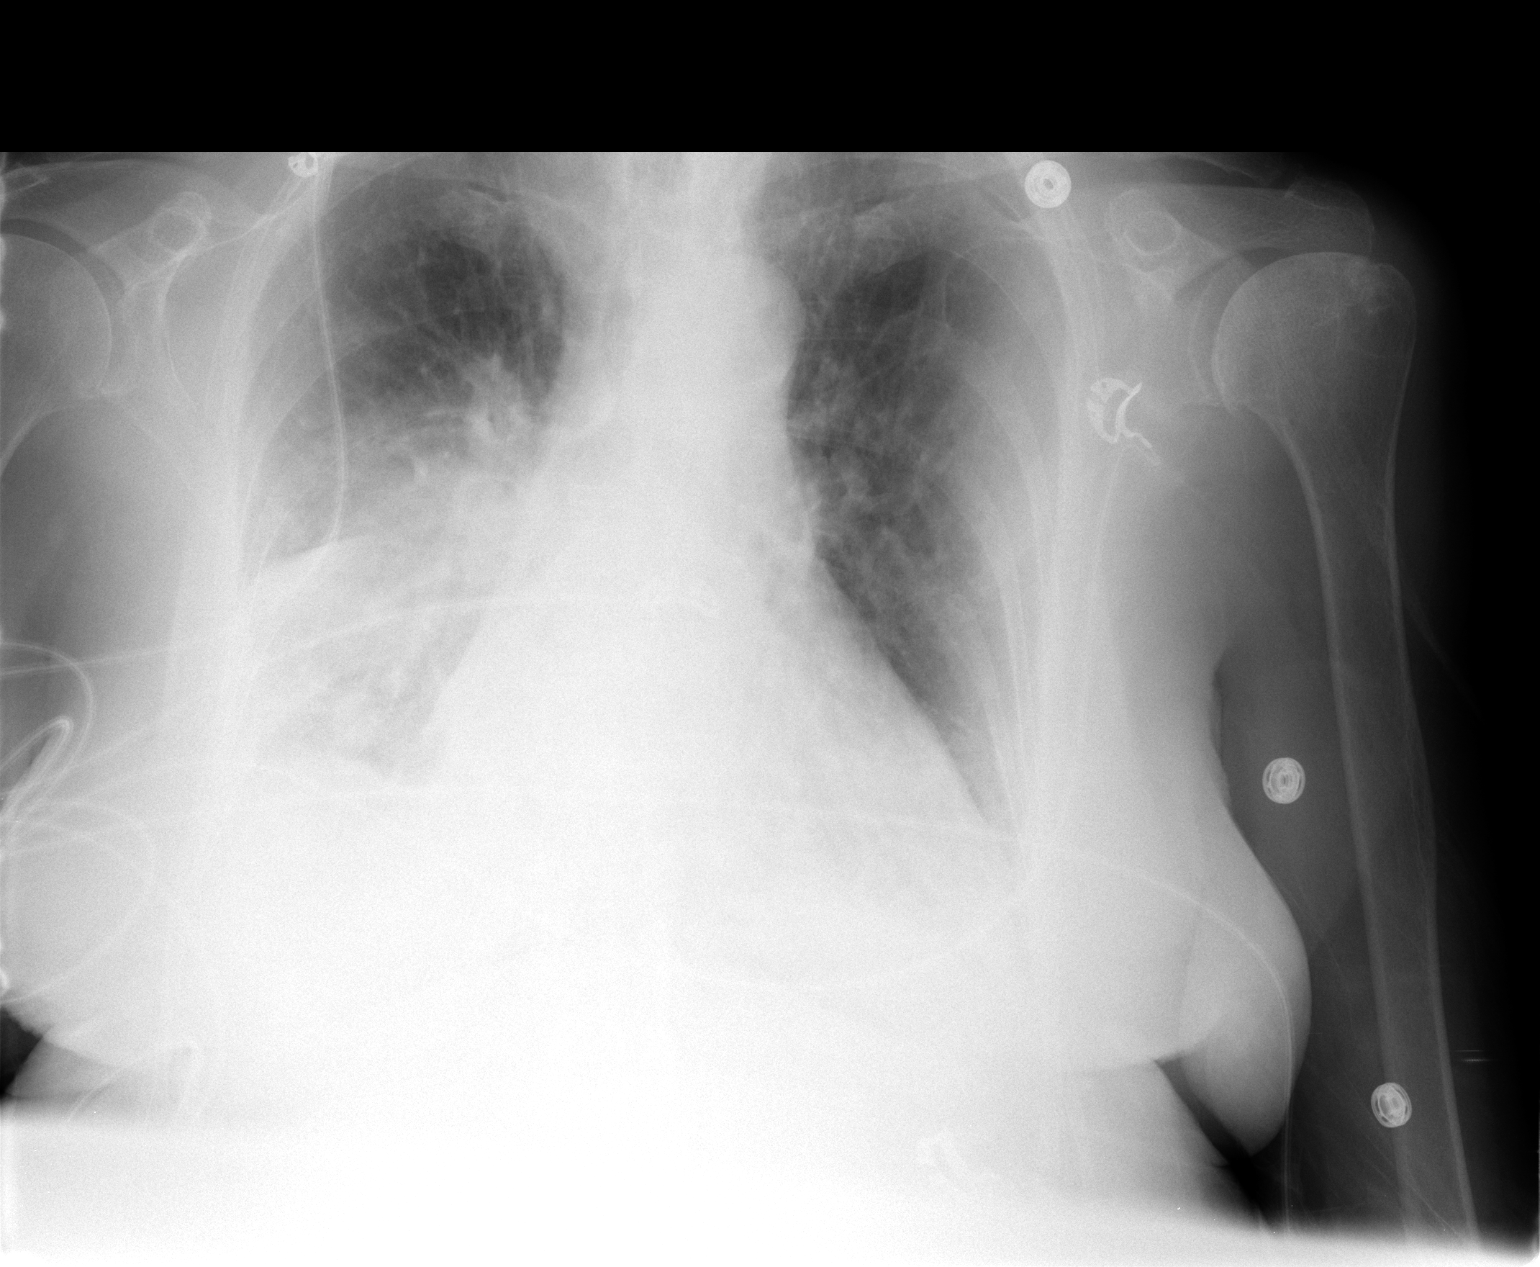

[2 of 2 positions shown; findings below may reference images not displayed]

FINDINGS: The cardiac silhouette remains enlarged. The patient has taken a
shallower inspiration than on the prior study. Right lower lobe
airspace opacity has increased. Small right pleural effusion has
also increased in size. Streaky retrocardiac opacity is present in
the left lower lobe. No definite left pleural effusion or
pneumothorax is identified. VP shunt catheter tubing is noted
coursing to the abdomen. Aortic calcification is noted. Grade [DATE]
anterolisthesis is again seen near the cervicothoracic junction.
IMPRESSION: 1. Increasing right lower lobe consolidation concerning for
worsening pneumonia. Mildly increased size of small right pleural
effusion. Continued radiographic follow-up recommended.
2. Subsegmental atelectasis versus infiltrate in the left lower
lobe.

## 2016-05-30 IMAGING — DX DG CHEST 1V PORT
1 series · 1 of 1 positions shown · non-contrast
Comparison: 12/08/2013

CLINICAL DATA: Increasing shortness of breath this morning.
Difficulty breathing.

EXAM:
PORTABLE CHEST - 1 VIEW

[chest ap]
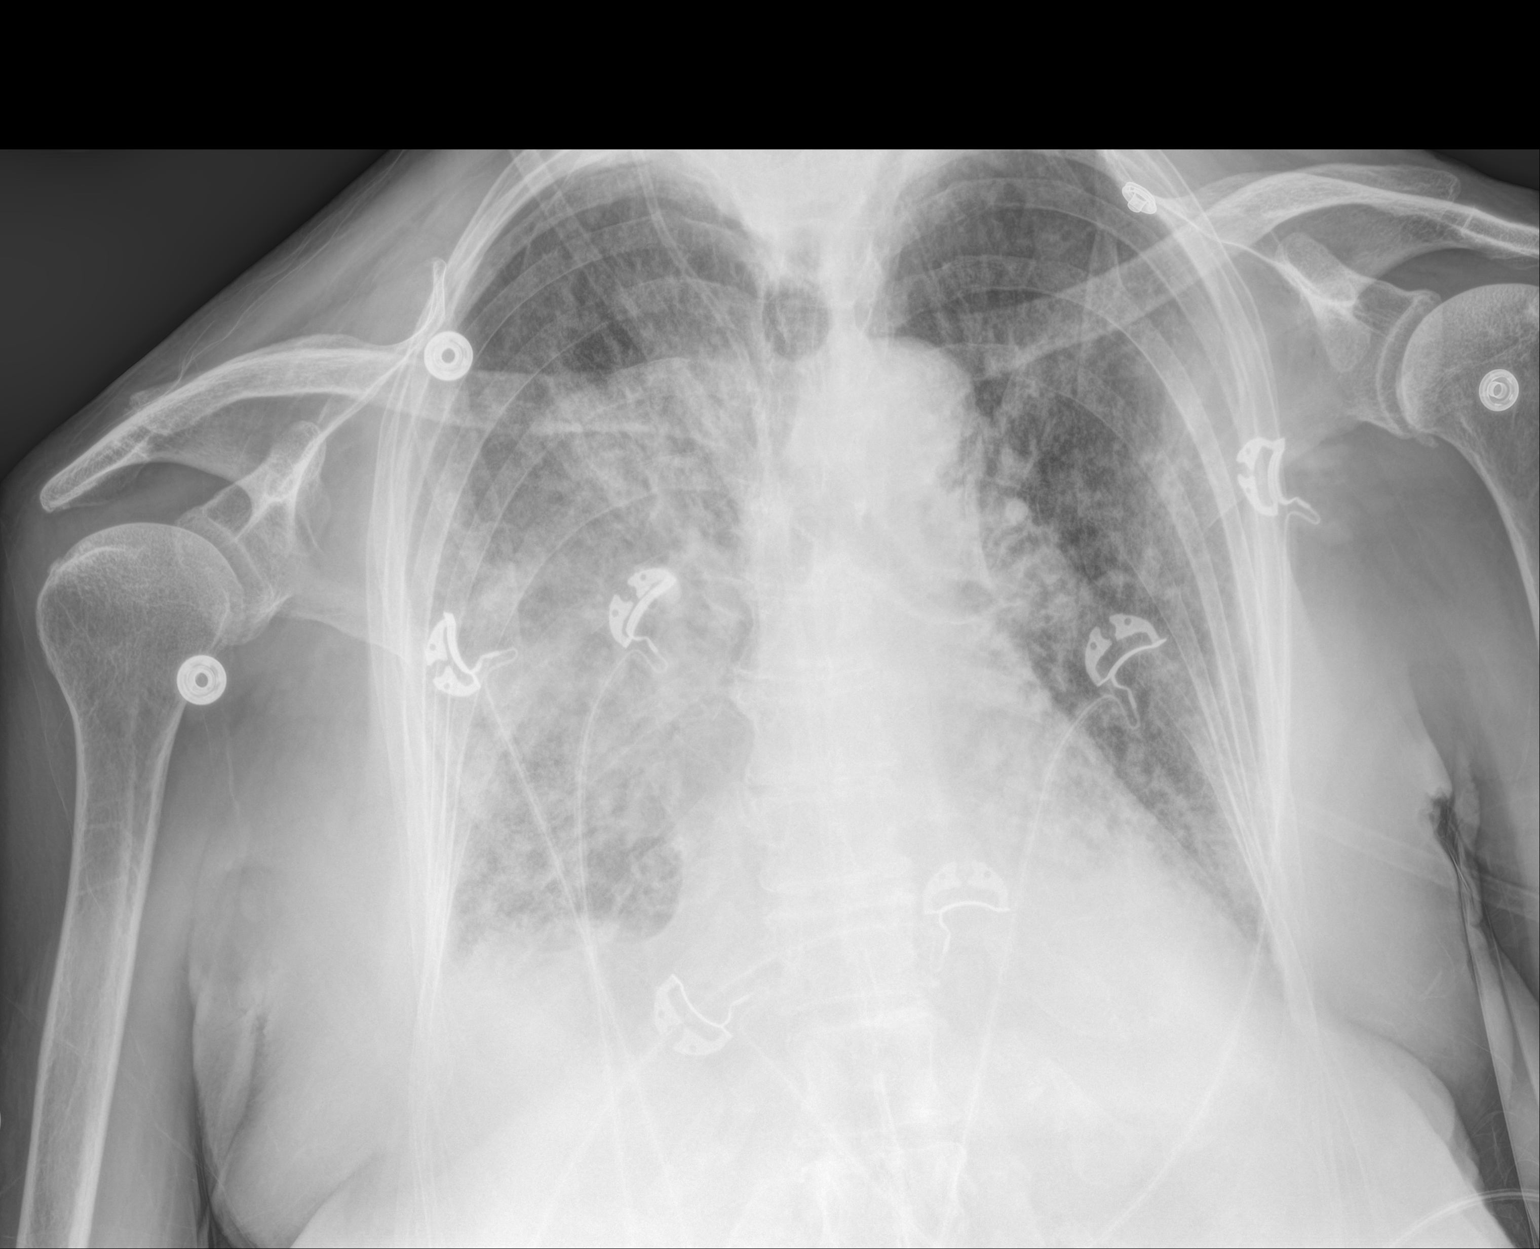

[1 of 1 positions shown; findings below may reference images not displayed]

FINDINGS: Mild cardiac enlargement and central pulmonary vascular congestion.
Right perihilar infiltration consistent with edema or pneumonia.
This appears to be increasing since previous study. Small right
pleural effusion is less prominent. This may be due to differences
in patient positioning. No pneumothorax.
IMPRESSION: Cardiac enlargement with pulmonary vascular congestion. Increasing
right perihilar infiltration or edema. Small right pleural effusion.
